# Patient Record
Sex: Female | Born: 1970 | Race: White | Hispanic: No | Marital: Married | Smoking: Never smoker
Health system: Southern US, Community
[De-identification: ages and names within clinical notes are randomized; demographics above are authoritative.]

## PROBLEM LIST (undated history)

## (undated) DIAGNOSIS — J45909 Unspecified asthma, uncomplicated: Secondary | ICD-10-CM

## (undated) DIAGNOSIS — Z9889 Other specified postprocedural states: Secondary | ICD-10-CM

## (undated) DIAGNOSIS — R51 Headache: Secondary | ICD-10-CM

## (undated) DIAGNOSIS — I10 Essential (primary) hypertension: Secondary | ICD-10-CM

## (undated) DIAGNOSIS — Z87442 Personal history of urinary calculi: Secondary | ICD-10-CM

## (undated) DIAGNOSIS — R519 Headache, unspecified: Secondary | ICD-10-CM

## (undated) DIAGNOSIS — R112 Nausea with vomiting, unspecified: Secondary | ICD-10-CM

## (undated) DIAGNOSIS — N200 Calculus of kidney: Secondary | ICD-10-CM

## (undated) HISTORY — PX: TONSILLECTOMY: SUR1361

## (undated) HISTORY — PX: OTHER SURGICAL HISTORY: SHX169

## (undated) HISTORY — DX: Calculus of kidney: N20.0

## (undated) HISTORY — PX: WISDOM TOOTH EXTRACTION: SHX21

## (undated) SURGICAL SUPPLY — 11 items
BAG RETRIEVAL 12/15 (BASKET) IMPLANT
DRIVER NDL MEGA SUTCUT DVNCXI (INSTRUMENTS) ×1 IMPLANT
IRRIG SUCT STRYKERFLOW 2 WTIP (MISCELLANEOUS) IMPLANT
NDL HYPO 21X1.5 SAFETY (NEEDLE) ×1 IMPLANT
NDL INSUFFLATION 14GA 120MM (NEEDLE) IMPLANT
NDL SPNL 20GX3.5 QUINCKE YW (NEEDLE) IMPLANT
OBTURATOR OPTICAL STND 8 DVNC (TROCAR) IMPLANT
RTRCTR WOUND ALEXIS 34CM XLRG (MISCELLANEOUS) ×1 IMPLANT
SYS BAG RETRIEVAL 10MM (BASKET) IMPLANT
SYS RETRIEVAL 5MM INZII UNIV (BASKET) IMPLANT
SYS WOUND ALEXIS 18CM MED (MISCELLANEOUS) IMPLANT

---

## 2018-02-13 ENCOUNTER — Other Ambulatory Visit: Payer: Self-pay | Admitting: Family Medicine

## 2018-02-13 DIAGNOSIS — R222 Localized swelling, mass and lump, trunk: Secondary | ICD-10-CM

## 2018-02-17 ENCOUNTER — Other Ambulatory Visit: Payer: Self-pay | Admitting: Family Medicine

## 2018-02-17 ENCOUNTER — Ambulatory Visit
Admission: RE | Admit: 2018-02-17 | Discharge: 2018-02-17 | Disposition: A | Discharge status: Home / Self Care | Payer: BC Managed Care – PPO | Source: Ambulatory Visit | Attending: Family Medicine | Admitting: Family Medicine

## 2018-02-17 DIAGNOSIS — R222 Localized swelling, mass and lump, trunk: Secondary | ICD-10-CM

## 2018-02-28 ENCOUNTER — Other Ambulatory Visit: Payer: Self-pay | Admitting: Family Medicine

## 2018-02-28 DIAGNOSIS — R222 Localized swelling, mass and lump, trunk: Secondary | ICD-10-CM

## 2018-03-04 ENCOUNTER — Ambulatory Visit
Admission: RE | Admit: 2018-03-04 | Discharge: 2018-03-04 | Disposition: A | Discharge status: Home / Self Care | Payer: BC Managed Care – PPO | Source: Ambulatory Visit | Attending: Family Medicine | Admitting: Family Medicine

## 2018-03-04 DIAGNOSIS — R222 Localized swelling, mass and lump, trunk: Secondary | ICD-10-CM

## 2018-03-04 MED ORDER — GADOBENATE DIMEGLUMINE 529 MG/ML IV SOLN
18.0000 mL | Freq: Once | INTRAVENOUS | Status: AC | PRN
  Administered 2018-03-04: 18 mL via INTRAVENOUS

## 2018-03-13 ENCOUNTER — Other Ambulatory Visit: Payer: Self-pay | Admitting: General Surgery

## 2018-03-15 ENCOUNTER — Encounter (HOSPITAL_COMMUNITY): Payer: Self-pay | Admitting: *Deleted

## 2018-03-15 ENCOUNTER — Other Ambulatory Visit: Payer: Self-pay

## 2018-03-15 NOTE — Progress Notes (Signed)
Spoke with pt for pre-op call. Pt denies cardiac history or diabetes. Pt is treated for HTN,

## 2018-03-16 ENCOUNTER — Encounter (HOSPITAL_COMMUNITY)
Admission: RE | Disposition: A | Discharge status: Home / Self Care | Payer: Self-pay | Source: Ambulatory Visit | Attending: General Surgery

## 2018-03-16 ENCOUNTER — Encounter (HOSPITAL_COMMUNITY): Payer: Self-pay | Admitting: *Deleted

## 2018-03-16 ENCOUNTER — Ambulatory Visit (HOSPITAL_COMMUNITY): Payer: BC Managed Care – PPO | Admitting: Certified Registered Nurse Anesthetist

## 2018-03-16 ENCOUNTER — Ambulatory Visit (HOSPITAL_COMMUNITY)
Admission: RE | Admit: 2018-03-16 | Discharge: 2018-03-16 | Disposition: A | Discharge status: Home / Self Care | Payer: BC Managed Care – PPO | Source: Ambulatory Visit | Attending: General Surgery | Admitting: General Surgery

## 2018-03-16 DIAGNOSIS — E669 Obesity, unspecified: Secondary | ICD-10-CM | POA: Diagnosis not present

## 2018-03-16 DIAGNOSIS — Z79899 Other long term (current) drug therapy: Secondary | ICD-10-CM | POA: Insufficient documentation

## 2018-03-16 DIAGNOSIS — Z820 Family history of epilepsy and other diseases of the nervous system: Secondary | ICD-10-CM | POA: Diagnosis not present

## 2018-03-16 DIAGNOSIS — R222 Localized swelling, mass and lump, trunk: Secondary | ICD-10-CM | POA: Diagnosis present

## 2018-03-16 DIAGNOSIS — Z885 Allergy status to narcotic agent status: Secondary | ICD-10-CM | POA: Diagnosis not present

## 2018-03-16 DIAGNOSIS — R51 Headache: Secondary | ICD-10-CM | POA: Diagnosis not present

## 2018-03-16 DIAGNOSIS — Z809 Family history of malignant neoplasm, unspecified: Secondary | ICD-10-CM | POA: Insufficient documentation

## 2018-03-16 DIAGNOSIS — Z6834 Body mass index (BMI) 34.0-34.9, adult: Secondary | ICD-10-CM | POA: Insufficient documentation

## 2018-03-16 DIAGNOSIS — D171 Benign lipomatous neoplasm of skin and subcutaneous tissue of trunk: Secondary | ICD-10-CM | POA: Insufficient documentation

## 2018-03-16 DIAGNOSIS — Z808 Family history of malignant neoplasm of other organs or systems: Secondary | ICD-10-CM | POA: Insufficient documentation

## 2018-03-16 DIAGNOSIS — J45909 Unspecified asthma, uncomplicated: Secondary | ICD-10-CM | POA: Diagnosis not present

## 2018-03-16 DIAGNOSIS — I10 Essential (primary) hypertension: Secondary | ICD-10-CM | POA: Diagnosis not present

## 2018-03-16 DIAGNOSIS — Z8249 Family history of ischemic heart disease and other diseases of the circulatory system: Secondary | ICD-10-CM | POA: Insufficient documentation

## 2018-03-16 DIAGNOSIS — M549 Dorsalgia, unspecified: Secondary | ICD-10-CM | POA: Insufficient documentation

## 2018-03-16 HISTORY — DX: Headache, unspecified: R51.9

## 2018-03-16 HISTORY — DX: Other specified postprocedural states: Z98.890

## 2018-03-16 HISTORY — DX: Unspecified asthma, uncomplicated: J45.909

## 2018-03-16 HISTORY — PX: EXCISION OF KELOID: SHX6267

## 2018-03-16 HISTORY — DX: Essential (primary) hypertension: I10

## 2018-03-16 HISTORY — DX: Nausea with vomiting, unspecified: R11.2

## 2018-03-16 HISTORY — DX: Headache: R51

## 2018-03-16 LAB — BASIC METABOLIC PANEL
Anion gap: 9 (ref 5–15)
BUN: 10 mg/dL (ref 6–20)
CALCIUM: 8.9 mg/dL (ref 8.9–10.3)
CO2: 23 mmol/L (ref 22–32)
CREATININE: 0.66 mg/dL (ref 0.44–1.00)
Chloride: 106 mmol/L (ref 101–111)
GFR calc Af Amer: >60 mL/min (ref >60)
GFR calc non Af Amer: >60 mL/min (ref >60)
GLUCOSE: 103 mg/dL — AB (ref 65–99)
Potassium: 3.7 mmol/L (ref 3.5–5.1)
Sodium: 138 mmol/L (ref 135–145)

## 2018-03-16 LAB — CBC
HEMATOCRIT: 43.9 % (ref 36.0–46.0)
HEMOGLOBIN: 15.3 g/dL — AB (ref 12.0–15.0)
MCH: 30.3 pg (ref 26.0–34.0)
MCHC: 34.9 g/dL (ref 30.0–36.0)
MCV: 86.9 fL (ref 78.0–100.0)
Platelets: 338 10*3/uL (ref 150–400)
RBC: 5.05 MIL/uL (ref 3.87–5.11)
RDW: 13.5 % (ref 11.5–15.5)
WBC: 7.1 10*3/uL (ref 4.0–10.5)

## 2018-03-16 SURGERY — EXCISION, KELOID
Anesthesia: General | Site: Back | Laterality: Right

## 2018-03-16 MED ORDER — LIDOCAINE 2% (20 MG/ML) 5 ML SYRINGE
INTRAMUSCULAR | Status: AC
  Filled 2018-03-16: qty 5

## 2018-03-16 MED ORDER — CELECOXIB 200 MG PO CAPS
200.0000 mg | ORAL_CAPSULE | ORAL | Status: AC
  Administered 2018-03-16: 200 mg via ORAL
  Filled 2018-03-16: qty 1

## 2018-03-16 MED ORDER — FENTANYL CITRATE (PF) 250 MCG/5ML IJ SOLN
INTRAMUSCULAR | Status: AC
  Filled 2018-03-16: qty 5

## 2018-03-16 MED ORDER — PROPOFOL 10 MG/ML IV BOLUS
INTRAVENOUS | Status: DC | PRN
  Administered 2018-03-16: 150 mg via INTRAVENOUS

## 2018-03-16 MED ORDER — MIDAZOLAM HCL 2 MG/2ML IJ SOLN
INTRAMUSCULAR | Status: AC
  Filled 2018-03-16: qty 2

## 2018-03-16 MED ORDER — DEXAMETHASONE SODIUM PHOSPHATE 10 MG/ML IJ SOLN
INTRAMUSCULAR | Status: AC
  Filled 2018-03-16: qty 1

## 2018-03-16 MED ORDER — CEFAZOLIN SODIUM-DEXTROSE 2-4 GM/100ML-% IV SOLN
2.0000 g | INTRAVENOUS | Status: AC
  Administered 2018-03-16: 2 g via INTRAVENOUS
  Filled 2018-03-16: qty 100

## 2018-03-16 MED ORDER — MIDAZOLAM HCL 2 MG/2ML IJ SOLN
INTRAMUSCULAR | Status: DC | PRN
  Administered 2018-03-16 (×2): 1 mg via INTRAVENOUS

## 2018-03-16 MED ORDER — CHLORHEXIDINE GLUCONATE CLOTH 2 % EX PADS: 6.0000 | MEDICATED_PAD | Freq: Once | CUTANEOUS | Status: DC

## 2018-03-16 MED ORDER — ROCURONIUM BROMIDE 10 MG/ML (PF) SYRINGE
PREFILLED_SYRINGE | INTRAVENOUS | Status: DC | PRN
  Administered 2018-03-16: 40 mg via INTRAVENOUS

## 2018-03-16 MED ORDER — SODIUM CHLORIDE 0.9 % IJ SOLN
INTRAMUSCULAR | Status: DC | PRN
  Administered 2018-03-16: 20 mL via INTRAVENOUS

## 2018-03-16 MED ORDER — ONDANSETRON HCL 4 MG/2ML IJ SOLN
INTRAMUSCULAR | Status: AC
  Filled 2018-03-16: qty 2

## 2018-03-16 MED ORDER — ROCURONIUM BROMIDE 10 MG/ML (PF) SYRINGE
PREFILLED_SYRINGE | INTRAVENOUS | Status: AC
  Filled 2018-03-16: qty 5

## 2018-03-16 MED ORDER — KETOROLAC TROMETHAMINE 30 MG/ML IJ SOLN
INTRAMUSCULAR | Status: AC
  Filled 2018-03-16: qty 1

## 2018-03-16 MED ORDER — FENTANYL CITRATE (PF) 100 MCG/2ML IJ SOLN
INTRAMUSCULAR | Status: AC
  Filled 2018-03-16: qty 2

## 2018-03-16 MED ORDER — EPHEDRINE 5 MG/ML INJ
INTRAVENOUS | Status: AC
  Filled 2018-03-16: qty 10

## 2018-03-16 MED ORDER — PHENYLEPHRINE 40 MCG/ML (10ML) SYRINGE FOR IV PUSH (FOR BLOOD PRESSURE SUPPORT)
PREFILLED_SYRINGE | INTRAVENOUS | Status: DC | PRN
  Administered 2018-03-16: 80 ug via INTRAVENOUS

## 2018-03-16 MED ORDER — SUGAMMADEX SODIUM 200 MG/2ML IV SOLN
INTRAVENOUS | Status: DC | PRN
  Administered 2018-03-16: 180 mg via INTRAVENOUS

## 2018-03-16 MED ORDER — PROMETHAZINE HCL 25 MG/ML IJ SOLN: 6.2500 mg | INTRAMUSCULAR | Status: DC | PRN

## 2018-03-16 MED ORDER — DEXAMETHASONE SODIUM PHOSPHATE 10 MG/ML IJ SOLN
INTRAMUSCULAR | Status: DC | PRN
  Administered 2018-03-16: 10 mg via INTRAVENOUS

## 2018-03-16 MED ORDER — SUCCINYLCHOLINE CHLORIDE 200 MG/10ML IV SOSY
PREFILLED_SYRINGE | INTRAVENOUS | Status: AC
  Filled 2018-03-16: qty 10

## 2018-03-16 MED ORDER — OXYCODONE HCL 5 MG PO TABS: 2.5000 mg | ORAL_TABLET | Freq: Four times a day (QID) | ORAL | 0 refills | Status: DC | PRN

## 2018-03-16 MED ORDER — FENTANYL CITRATE (PF) 250 MCG/5ML IJ SOLN
INTRAMUSCULAR | Status: DC | PRN
  Administered 2018-03-16: 50 ug via INTRAVENOUS
  Administered 2018-03-16: 100 ug via INTRAVENOUS

## 2018-03-16 MED ORDER — LIDOCAINE 2% (20 MG/ML) 5 ML SYRINGE
INTRAMUSCULAR | Status: DC | PRN
  Administered 2018-03-16: 80 mg via INTRAVENOUS

## 2018-03-16 MED ORDER — PROPOFOL 10 MG/ML IV BOLUS
INTRAVENOUS | Status: AC
  Filled 2018-03-16: qty 20

## 2018-03-16 MED ORDER — ONDANSETRON HCL 4 MG/2ML IJ SOLN
INTRAMUSCULAR | Status: DC | PRN
  Administered 2018-03-16: 4 mg via INTRAVENOUS

## 2018-03-16 MED ORDER — ACETAMINOPHEN 500 MG PO TABS
1000.0000 mg | ORAL_TABLET | ORAL | Status: AC
  Administered 2018-03-16: 1000 mg via ORAL
  Filled 2018-03-16: qty 2

## 2018-03-16 MED ORDER — 0.9 % SODIUM CHLORIDE (POUR BTL) OPTIME
TOPICAL | Status: DC | PRN
  Administered 2018-03-16: 1000 mL

## 2018-03-16 MED ORDER — BUPIVACAINE LIPOSOME 1.3 % IJ SUSP
20.0000 mL | INTRAMUSCULAR | Status: AC
  Administered 2018-03-16: 20 mL
  Filled 2018-03-16: qty 20

## 2018-03-16 MED ORDER — SUGAMMADEX SODIUM 200 MG/2ML IV SOLN
INTRAVENOUS | Status: AC
  Filled 2018-03-16: qty 2

## 2018-03-16 MED ORDER — PHENYLEPHRINE 40 MCG/ML (10ML) SYRINGE FOR IV PUSH (FOR BLOOD PRESSURE SUPPORT)
PREFILLED_SYRINGE | INTRAVENOUS | Status: AC
  Filled 2018-03-16: qty 10

## 2018-03-16 MED ORDER — GABAPENTIN 300 MG PO CAPS
300.0000 mg | ORAL_CAPSULE | ORAL | Status: AC
  Administered 2018-03-16: 300 mg via ORAL
  Filled 2018-03-16: qty 1

## 2018-03-16 MED ORDER — LACTATED RINGERS IV SOLN
INTRAVENOUS | Status: DC
  Administered 2018-03-16: 09:00:00 via INTRAVENOUS

## 2018-03-16 MED ORDER — FENTANYL CITRATE (PF) 100 MCG/2ML IJ SOLN
25.0000 ug | INTRAMUSCULAR | Status: DC | PRN
  Administered 2018-03-16: 50 ug via INTRAVENOUS

## 2018-03-16 MED ORDER — KETOROLAC TROMETHAMINE 30 MG/ML IJ SOLN
INTRAMUSCULAR | Status: DC | PRN
  Administered 2018-03-16: 30 mg via INTRAVENOUS

## 2018-03-16 SURGICAL SUPPLY — 52 items
BENZOIN TINCTURE PRP APPL 2/3 (GAUZE/BANDAGES/DRESSINGS) ×3 IMPLANT
BLADE SURG 10 STRL SS (BLADE) ×3 IMPLANT
CANISTER SUCT 3000ML PPV (MISCELLANEOUS) ×3 IMPLANT
CHLORAPREP W/TINT 26ML (MISCELLANEOUS) ×3 IMPLANT
CLIP TI LARGE 6 (CLIP) ×6 IMPLANT
CLOSURE WOUND 1/2 X4 (GAUZE/BANDAGES/DRESSINGS) ×1
COVER SURGICAL LIGHT HANDLE (MISCELLANEOUS) ×3 IMPLANT
DERMABOND ADVANCED (GAUZE/BANDAGES/DRESSINGS)
DERMABOND ADVANCED .7 DNX12 (GAUZE/BANDAGES/DRESSINGS) IMPLANT
DRAPE LAPAROSCOPIC ABDOMINAL (DRAPES) ×3 IMPLANT
DRAPE LAPAROTOMY 100X72 PEDS (DRAPES) IMPLANT
DRAPE UTILITY XL STRL (DRAPES) ×6 IMPLANT
DRSG TEGADERM 4X4.75 (GAUZE/BANDAGES/DRESSINGS) ×6 IMPLANT
ELECT CAUTERY BLADE 6.4 (BLADE) ×6 IMPLANT
ELECT REM PT RETURN 9FT ADLT (ELECTROSURGICAL) ×3
ELECTRODE REM PT RTRN 9FT ADLT (ELECTROSURGICAL) ×1 IMPLANT
GAUZE SPONGE 4X4 12PLY STRL (GAUZE/BANDAGES/DRESSINGS) IMPLANT
GAUZE SPONGE 4X4 16PLY XRAY LF (GAUZE/BANDAGES/DRESSINGS) ×3 IMPLANT
GLOVE BIO SURGEON STRL SZ 6 (GLOVE) ×3 IMPLANT
GLOVE BIOGEL PI IND STRL 6.5 (GLOVE) ×2 IMPLANT
GLOVE BIOGEL PI INDICATOR 6.5 (GLOVE) ×4
GLOVE INDICATOR 6.5 STRL GRN (GLOVE) ×3 IMPLANT
GOWN STRL REUS W/ TWL LRG LVL3 (GOWN DISPOSABLE) ×1 IMPLANT
GOWN STRL REUS W/TWL 2XL LVL3 (GOWN DISPOSABLE) ×3 IMPLANT
GOWN STRL REUS W/TWL LRG LVL3 (GOWN DISPOSABLE) ×2
KIT BASIN OR (CUSTOM PROCEDURE TRAY) ×3 IMPLANT
KIT TURNOVER KIT B (KITS) ×3 IMPLANT
NEEDLE HYPO 25GX1X1/2 BEV (NEEDLE) ×6 IMPLANT
NS IRRIG 1000ML POUR BTL (IV SOLUTION) ×3 IMPLANT
PACK SURGICAL SETUP 50X90 (CUSTOM PROCEDURE TRAY) ×3 IMPLANT
PAD ARMBOARD 7.5X6 YLW CONV (MISCELLANEOUS) ×6 IMPLANT
PENCIL BUTTON HOLSTER BLD 10FT (ELECTRODE) ×3 IMPLANT
SPECIMEN JAR SMALL (MISCELLANEOUS) ×3 IMPLANT
SPONGE LAP 18X18 X RAY DECT (DISPOSABLE) ×3 IMPLANT
SPONGE LAP 4X18 X RAY DECT (DISPOSABLE) IMPLANT
STRIP CLOSURE SKIN 1/2X4 (GAUZE/BANDAGES/DRESSINGS) ×2 IMPLANT
SUT MNCRL AB 4-0 PS2 18 (SUTURE) ×3 IMPLANT
SUT MON AB 4-0 PC3 18 (SUTURE) ×3 IMPLANT
SUT SILK 2 0 PERMA HAND 18 BK (SUTURE) IMPLANT
SUT SILK 2 0 SH CR/8 (SUTURE) ×3 IMPLANT
SUT VIC AB 2-0 CT1 27 (SUTURE) ×8
SUT VIC AB 2-0 CT1 TAPERPNT 27 (SUTURE) ×4 IMPLANT
SUT VIC AB 3-0 SH 27 (SUTURE) ×4
SUT VIC AB 3-0 SH 27X BRD (SUTURE) ×2 IMPLANT
SUT VIC AB 3-0 SH 8-18 (SUTURE) ×3 IMPLANT
SYR BULB 3OZ (MISCELLANEOUS) ×6 IMPLANT
SYR CONTROL 10ML LL (SYRINGE) ×6 IMPLANT
TOWEL OR 17X24 6PK STRL BLUE (TOWEL DISPOSABLE) ×3 IMPLANT
TOWEL OR 17X26 10 PK STRL BLUE (TOWEL DISPOSABLE) ×3 IMPLANT
TUBE CONNECTING 12'X1/4 (SUCTIONS)
TUBE CONNECTING 12X1/4 (SUCTIONS) IMPLANT
YANKAUER SUCT BULB TIP NO VENT (SUCTIONS) IMPLANT

## 2018-03-16 NOTE — Interval H&P Note (Signed)
History and Physical Interval Note:  03/16/2018 10:40 AM  Haley Roach  has presented today for surgery, with the diagnosis of BACK MASS  The various methods of treatment have been discussed with the patient and family. After consideration of risks, benefits and other options for treatment, the patient has consented to  Procedure(s): EXCISION OF LOWER RIGHT BACK MASS (Right) as a surgical intervention .  The patient's history has been reviewed, patient examined, no change in status, stable for surgery.  I have reviewed the patient's chart and labs.  Questions were answered to the patient's satisfaction.     Stark Klein

## 2018-03-16 NOTE — Op Note (Signed)
PRE-OPERATIVE DIAGNOSIS: right back mass  POST-OPERATIVE DIAGNOSIS:  Same  PROCEDURE:  Procedure(s): Excision of right back mass, 5x12x5 cm, partially subfascial  SURGEON:  Surgeon(s): Stark Klein, MD  ANESTHESIA:   general and exparel  DRAINS: none   LOCAL MEDICATIONS USED:  OTHER exparel  SPECIMEN:  Source of Specimen:  right back mass  DISPOSITION OF SPECIMEN:  PATHOLOGY  COUNTS:  YES  DICTATION: .Dragon Dictation  PLAN OF CARE: Discharge to home after PACU  PATIENT DISPOSITION:  PACU - hemodynamically stable.  FINDINGS:  Soft fatty mass except for small area of firmness medially  EBL: <25 mL  PROCEDURE:  Patient was identified in the holding area where she was taken to the operating room and intubated while supine on the stretcher.  Patient was then placed into the prone jackknife position.  The right back was prepped and draped in sterile fashion.  The mass was at the superior aspect of the iliac crest.  A timeout was performed according to the surgical safety checklist.  When all was correct, we continued.    A transverse incision was made overlying the line of the mass.  The subcutaneous tissues were divided with the cautery.  The mass was identified and was dissected out in all directions.  The medial and deep portion of the mass was subfascial in nature.  The fascia was divided with the cautery.  There were several small feeding vessels that were suture-ligated.  The cautery was used to create flaps around the mass.  The wound was irrigated.  Large clips were placed in the cavity at the furthest extent of the mass in all directions.  Once adequate hemostasis had been achieved, Exparel was administered in the superficial and deep layer of the incision.  The wound was then closed, taking care to minimize the cavity.  3 running rows of 2-0 Vicryl were used to close down the space.  The skin was then reapproximated with three 3-0 Vicryl deep dermal sutures.  Is closed with  a running 4-0 Monocryl suture.  The wound was then cleaned, dried, and dressed with benzoin, Steri-Strips, gauze, and Tegaderm.  Needle, sponge, and instrument counts were correct x2.    The patient was then flipped back to the stretcher.  She was allowed to emerge from anesthesia and taken to the PACU in stable condition.

## 2018-03-16 NOTE — Progress Notes (Signed)
Urine preg negative

## 2018-03-16 NOTE — Anesthesia Preprocedure Evaluation (Addendum)
Anesthesia Evaluation  Patient identified by MRN, date of birth, ID band Patient awake    Reviewed: Allergy & Precautions, NPO status , Patient's Chart, lab work & pertinent test results  History of Anesthesia Complications (+) PONV and history of anesthetic complications  Airway Mallampati: II  TM Distance: >3 FB Neck ROM: Full    Dental  (+) Teeth Intact, Dental Advisory Given   Pulmonary asthma ,    Pulmonary exam normal breath sounds clear to auscultation       Cardiovascular hypertension, Pt. on home beta blockers Normal cardiovascular exam Rhythm:Regular Rate:Normal     Neuro/Psych  Headaches, negative psych ROS   GI/Hepatic negative GI ROS, Neg liver ROS,   Endo/Other  Obesity   Renal/GU negative Renal ROS     Musculoskeletal negative musculoskeletal ROS (+)   Abdominal   Peds  Hematology negative hematology ROS (+)   Anesthesia Other Findings Day of surgery medications reviewed with the patient.  Reproductive/Obstetrics                            Anesthesia Physical Anesthesia Plan  ASA: II  Anesthesia Plan: General   Post-op Pain Management:    Induction: Intravenous  PONV Risk Score and Plan: 4 or greater and Ondansetron, Dexamethasone and Midazolam  Airway Management Planned: Oral ETT  Additional Equipment:   Intra-op Plan:   Post-operative Plan: Extubation in OR  Informed Consent: I have reviewed the patients History and Physical, chart, labs and discussed the procedure including the risks, benefits and alternatives for the proposed anesthesia with the patient or authorized representative who has indicated his/her understanding and acceptance.   Dental advisory given  Plan Discussed with: CRNA and Anesthesiologist  Anesthesia Plan Comments: (Risks/benefits of general anesthesia discussed with patient including risk of damage to teeth, lips, gum, and tongue,  nausea/vomiting, allergic reactions to medications, and the possibility of heart attack, stroke and death.  All patient questions answered.  Patient wishes to proceed.)       Anesthesia Quick Evaluation

## 2018-03-16 NOTE — Discharge Instructions (Addendum)
[place] Office Phone Number [phone]   POST OP INSTRUCTIONS  Always review your discharge instruction sheet given to you by the facility where your surgery was performed.  IF YOU HAVE DISABILITY OR FAMILY LEAVE FORMS, YOU MUST BRING THEM TO THE OFFICE FOR PROCESSING.  DO NOT GIVE THEM TO YOUR DOCTOR.  1. A prescription for pain medication may be given to you upon discharge.  Take your pain medication as prescribed, if needed.  If narcotic pain medicine is not needed, then you may take acetaminophen (Tylenol) or ibuprofen (Advil) as needed. 2. Take your usually prescribed medications unless otherwise directed 3. If you need a refill on your pain medication, please contact your pharmacy.  They will contact our office to request authorization.  Prescriptions will not be filled after 5pm or on week-ends. 4. You should eat very light the first 24 hours after surgery, such as soup, crackers, pudding, etc.  Resume your normal diet the day after surgery 5. It is common to experience some constipation if taking pain medication after surgery.  Increasing fluid intake and taking a stool softener will usually help or prevent this problem from occurring.  A mild laxative (Milk of Magnesia or Miralax) should be taken according to package directions if there are no bowel movements after 48 hours. 6. You may shower in 48 hours.  The surgical glue will flake off in 2-3 weeks.   7. ACTIVITIES:  No strenuous activity or heavy lifting for 1 week.   a. You may drive when you no longer are taking prescription pain medication, you can comfortably wear a seatbelt, and you can safely maneuver your car and apply brakes. b. RETURN TO WORK:  __________2 weeks_______________ Haley Roach should see your doctor in the office for a follow-up appointment approximately three-four weeks after your surgery.    WHEN TO CALL YOUR DOCTOR: 1. Fever over 101.0 2. Nausea and/or vomiting. 3. Extreme swelling or  bruising. 4. Continued bleeding from incision. 5. Increased pain, redness, or drainage from the incision.  The clinic staff is available to answer your questions during regular business hours.  Please dont hesitate to call and ask to speak to one of the nurses for clinical concerns.  If you have a medical emergency, go to the nearest emergency room or call 911.  A surgeon from [facility] is always on call at the hospital.  For further questions, please visit [email or web address]    General Anesthesia, Adult, Care After These instructions provide you with information about caring for yourself after your procedure. Your health care provider may also give you more specific instructions. Your treatment has been planned according to current medical practices, but problems sometimes occur. Call your health care provider if you have any problems or questions after your procedure. What can I expect after the procedure? After the procedure, it is common to have:  Vomiting.  A sore throat.  Mental slowness.  It is common to feel:  Nauseous.  Cold or shivery.  Sleepy.  Tired.  Sore or achy, even in parts of your body where you did not have surgery.  Follow these instructions at home: For at least 24 hours after the procedure:  Do not: ? Participate in activities where you could fall or become injured. ? Drive. ? Use heavy machinery. ? Drink alcohol. ? Take sleeping pills or medicines that cause drowsiness. ? Make important decisions or sign legal documents. ? Take care of children on your own.  Rest. Eating and  drinking  If you vomit, drink water, juice, or soup when you can drink without vomiting.  Drink enough fluid to keep your urine clear or pale yellow.  Make sure you have little or no nausea before eating solid foods.  Follow the diet recommended by your health care provider. General instructions  Have a responsible adult stay with you until you  are awake and alert.  Return to your normal activities as told by your health care provider. Ask your health care provider what activities are safe for you.  Take over-the-counter and prescription medicines only as told by your health care provider.  If you smoke, do not smoke without supervision.  Keep all follow-up visits as told by your health care provider. This is important. Contact a health care provider if:  You continue to have nausea or vomiting at home, and medicines are not helpful.  You cannot drink fluids or start eating again.  You cannot urinate after 8-12 hours.  You develop a skin rash.  You have fever.  You have increasing redness at the site of your procedure. Get help right away if:  You have difficulty breathing.  You have chest pain.  You have unexpected bleeding.  You feel that you are having a life-threatening or urgent problem. This information is not intended to replace advice given to you by your health care provider. Make sure you discuss any questions you have with your health care provider. Document Released: 02/14/2001 Document Revised: 04/12/2016 Document Reviewed: 10/23/2015 Elsevier Interactive Patient Education  [employer].

## 2018-03-16 NOTE — H&P (Signed)
Haley Roach Documented: 03/13/2018 9:40 AM Location: [facility] Patient #: [id number] DOB: 19-Apr-1971 Married / Language: Cleophus Molt / Race: White Female   History of Present Illness Stark Klein MD; 03/13/2018 10:45 AM) The patient is a 47 year old female who presents with a complaint of Mass. Pt is a lovely 47 yo F who is referred for consultation by Kimberlee Nearing, PA-C for a new right back mass. She presented with a sense of swelling noted while exercising around 4-5 months ago. She felt that this back area was asymmetric. One of her friends noted this as well. She went for a medication check for her BP and brought it up. Given that it felt a little firm, an u/s, then MRI were performed. There was concern for a possible liposarcoma. She denies pain. She has not had any history of cancer. She does not recall any trauma to this area.    lumbar MRI 03/04/2018 IMPRESSION: 1. 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 Focal area of rim calcified fat within the medial aspect of the mass may represent internal fat necrosis.  These results will be called to the ordering clinician or representative by the Radiologist Assistant, and communication documented in the PACS or zVision Dashboard   Past Surgical History (April Staton, [place]; 03/13/2018 10:39 AM) Oral Surgery  Tonsillectomy   Diagnostic Studies History (April Staton, [place]; 03/13/2018 10:39 AM) Colonoscopy  never Mammogram  within last year Pap Smear  1-5 years ago  Allergies Sabino Gasser; 03/13/2018 9:41 AM) Codeine/Codeine Derivatives   Medication History Sabino Gasser; 03/13/2018 9:43 AM) Amitriptyline HCl (25MG  Tablet, Oral) Active. Atorvastatin Calcium (20MG  Tablet, Oral) Active. Metoprolol Succinate ER (25MG  Tablet ER 24HR, Oral)  Active. Medications Reconciled  Social History (April Staton, [place]; 03/13/2018 10:39 AM) Alcohol use  Occasional alcohol use. Caffeine use  Carbonated beverages. No drug use  Tobacco use  Never smoker.  Family History (April Staton, [place]; 03/13/2018 10:39 AM) Cancer  Family Members In General. Hypertension  Mother, Sister. Melanoma  Family Members In General. Migraine Headache  Daughter, Family Members In [place], Sister, Son.  Pregnancy / Birth History (April Staton, [place]; 03/13/2018 10:39 AM) Age at menarche  72 years. Gravida  2 Irregular periods  Length (months) of breastfeeding  3-6 Maternal age  52-25 Para  2  Other Problems (April Staton, CMA; 03/13/2018 10:39 AM) Back Pain  High blood pressure     Review of Systems (April Staton CMA; 03/13/2018 10:39 AM) General Not Present- Appetite Loss, Chills, Fatigue, Fever, Night Sweats, Weight Gain and Weight Loss. Skin Not Present- Change in Wart/Mole, Dryness, Hives, Jaundice, New Lesions, Non-Healing Wounds, Rash and Ulcer. HEENT Present- Wears glasses/contact lenses. Not Present- Earache, Hearing Loss, Hoarseness, Nose Bleed, Oral Ulcers, Ringing in the Ears, Seasonal Allergies, Sinus Pain, Sore Throat, Visual Disturbances and Yellow Eyes. Respiratory Not Present- Bloody sputum, Chronic Cough, Difficulty Breathing, Snoring and Wheezing. Breast Not Present- Breast Mass, Breast Pain, Nipple Discharge and Skin Changes. Cardiovascular Not Present- Chest Pain, Difficulty Breathing Lying Down, Leg Cramps, Palpitations, Rapid Heart Rate, Shortness of Breath and Swelling of Extremities. Gastrointestinal Not Present- Abdominal Pain, Bloating, Bloody Stool, Change in Bowel Habits, Chronic diarrhea, Constipation, Difficulty Swallowing, Excessive gas, Gets full quickly at meals, Hemorrhoids, Indigestion, Nausea, Rectal Pain and Vomiting. Female Genitourinary Not Present- Frequency, Nocturia, Painful Urination, Pelvic Pain and  Urgency. Musculoskeletal Present- Back Pain and  Swelling of Extremities. Not Present- Joint Pain, Joint Stiffness, Muscle Pain and Muscle Weakness. Neurological Not Present- Decreased Memory, Fainting, Headaches, Numbness, Seizures, Tingling, Tremor, Trouble walking and Weakness. Psychiatric Present- Anxiety. Not Present- Bipolar, Change in Sleep Pattern, Depression, Fearful and Frequent crying. Endocrine Present- Hot flashes. Not Present- Cold Intolerance, Excessive Hunger, Hair Changes, Heat Intolerance and New Diabetes. Hematology Not Present- Blood Thinners, Easy Bruising, Excessive bleeding, Gland problems, HIV and Persistent Infections.  Vitals Sabino Gasser; 03/13/2018 9:43 AM) 03/13/2018 9:42 AM Weight: 199.13 lb Height: 63in Body Surface Area: 1.93 m Body Mass Index: 35.27 kg/m  Temp.: 98.59F(Oral)  Pulse: 103 (Regular)  BP: 130/80 (Sitting, Left Arm, Standard)       Physical Exam Stark Klein MD; 03/13/2018 10:44 AM) General Mental Status-Alert. General Appearance-Consistent with stated age. Hydration-Well hydrated. Voice-Normal.  Integumentary Note: mobile mass on the right near the right iliac crest/wing. this is around 10-15 cm and difficult to feel.   Head and Neck Head-normocephalic, atraumatic with no lesions or palpable masses. Trachea-midline. Thyroid Gland Characteristics - normal size and consistency.  Eye Eyeball - Bilateral-Extraocular movements intact. Sclera/Conjunctiva - Bilateral-No scleral icterus.  Chest and Lung Exam Chest and lung exam reveals -quiet, even and easy respiratory effort with no use of accessory muscles and on auscultation, normal breath sounds, no adventitious sounds and normal vocal resonance. Inspection Chest Wall - Normal. Back - normal.  Cardiovascular Cardiovascular examination reveals -normal heart sounds, regular rate and rhythm with no murmurs and normal pedal pulses  bilaterally.  Abdomen Inspection Inspection of the abdomen reveals - No Hernias. Palpation/Percussion Palpation and Percussion of the abdomen reveal - Soft, Non Tender, No Rebound tenderness, No Rigidity (guarding) and No hepatosplenomegaly. Auscultation Auscultation of the abdomen reveals - Bowel sounds normal.  Neurologic Neurologic evaluation reveals -alert and oriented x 3 with no impairment of recent or remote memory. Mental Status-Normal.  Musculoskeletal Global Assessment -Note: no gross deformities.  Normal Exam - Left-Upper Extremity Strength Normal and Lower Extremity Strength Normal. Normal Exam - Right-Upper Extremity Strength Normal and Lower Extremity Strength Normal.  Lymphatic Head & Neck  General Head & Neck Lymphatics: Bilateral - Description - Normal. Axillary  General Axillary Region: Bilateral - Description - Normal. Tenderness - Non Tender. Femoral & Inguinal  Generalized Femoral & Inguinal Lymphatics: Bilateral - Description - No Generalized lymphadenopathy.    Assessment & Plan Stark Klein MD; 03/13/2018 10:47 AM) SUBCUTANEOUS MASS OF BACK (R22.2) Impression: There is clinical concern over possible liposarcoma of the mass. We will plan to excise it. I do not feel that biopsy would change management as there is significant possibility of sampling error if benign.  I would plan wider margins than normal lipoma. I would also plan to leave clips at the borders of the mass.  I reviewed risks of surgery including bleeding, infection, possible drain placement, possible recurrent cancer, possible need for additional surgeries.  I discussed time off work and restrictions, etc. Current Plans Schedule for Surgery Pt Education - [facility] General Post-op [facility]CI   Signed by Stark Klein, MD (03/13/2018 10:48 AM)

## 2018-03-16 NOTE — Anesthesia Procedure Notes (Signed)
Procedure Name: Intubation Date/Time: 03/16/2018 11:11 AM Performed by: Julieta Bellini, CRNA Pre-anesthesia Checklist: Patient identified, Emergency Drugs available, Suction available and Patient being monitored Patient Re-evaluated:Patient Re-evaluated prior to induction Oxygen Delivery Method: Circle system utilized Preoxygenation: Pre-oxygenation with 100% oxygen Induction Type: IV induction Ventilation: Mask ventilation without difficulty Laryngoscope Size: Mac and 3 Grade View: Grade I Tube type: Oral Tube size: 7.0 mm Number of attempts: 1 Airway Equipment and Method: Stylet Placement Confirmation: ETT inserted through vocal cords under direct vision,  positive ETCO2 and breath sounds checked- equal and bilateral Secured at: 22 cm Tube secured with: Tape Dental Injury: Teeth and Oropharynx as per pre-operative assessment

## 2018-03-16 NOTE — Transfer of Care (Signed)
Immediate Anesthesia Transfer of Care Note  Patient: Haley Roach  Procedure(s) Performed: EXCISION OF LOWER RIGHT BACK MASS (Right Back)  Patient Location: PACU  Anesthesia Type:General  Level of Consciousness: drowsy and patient cooperative  Airway & Oxygen Therapy: Patient Spontanous Breathing  Post-op Assessment: Report given to RN, Post -op Vital signs reviewed and stable and Patient moving all extremities X 4  Post vital signs: Reviewed and stable  Last Vitals:  Vitals Value Taken Time  BP 163/87 03/16/2018 12:35 PM  Temp    Pulse 82 03/16/2018 12:35 PM  Resp 22 03/16/2018 12:35 PM  SpO2 93 % 03/16/2018 12:35 PM  Vitals shown include unvalidated device data.  Last Pain:  Vitals:   03/16/18 0845  TempSrc:   PainSc: 0-No pain      Patients Stated Pain Goal: 2 (25/00/37 0488)  Complications: No apparent anesthesia complications

## 2018-03-17 ENCOUNTER — Encounter (HOSPITAL_COMMUNITY): Payer: Self-pay | Admitting: General Surgery

## 2018-03-17 NOTE — Anesthesia Postprocedure Evaluation (Signed)
Anesthesia Post Note  Patient: Haley Roach  Procedure(s) Performed: EXCISION OF LOWER RIGHT BACK MASS (Right Back)     Patient location during evaluation: PACU Anesthesia Type: General Level of consciousness: awake and alert Pain management: pain level controlled Vital Signs Assessment: post-procedure vital signs reviewed and stable Respiratory status: spontaneous breathing, nonlabored ventilation and respiratory function stable Cardiovascular status: blood pressure returned to baseline and stable Postop Assessment: no apparent nausea or vomiting Anesthetic complications: no    Last Vitals:  Vitals:   03/16/18 1335 03/16/18 1341  BP: (!) 148/88 (!) 157/93  Pulse: 91 86  Resp: 18 18  Temp: 36.6 C   SpO2: 99% 98%    Last Pain:  Vitals:   03/16/18 1341  TempSrc:   PainSc: 3                  Catalina Gravel

## 2018-03-20 NOTE — Progress Notes (Signed)
Please let patient know pathology is benign.

## 2018-08-13 ENCOUNTER — Other Ambulatory Visit (HOSPITAL_COMMUNITY)
Admission: RE | Admit: 2018-08-13 | Discharge: 2018-08-13 | Disposition: A | Discharge status: Home / Self Care | Payer: BC Managed Care – PPO | Source: Ambulatory Visit | Attending: Family Medicine | Admitting: Family Medicine

## 2018-08-13 ENCOUNTER — Other Ambulatory Visit: Payer: Self-pay | Admitting: Family Medicine

## 2018-08-13 DIAGNOSIS — Z01419 Encounter for gynecological examination (general) (routine) without abnormal findings: Secondary | ICD-10-CM | POA: Insufficient documentation

## 2018-08-17 LAB — CYTOLOGY - PAP: DIAGNOSIS: NEGATIVE

## 2018-12-27 IMAGING — MR MR LUMBAR SPINE WO/W CM
4 of 8 series · 22 of 48 positions shown · IV contrast (multihance)
Comparison: Right flank ultrasound dated February 17, 2018.

CLINICAL DATA: Right flank soft tissue mass for the past 5 months.
No known injury.

EXAM:
MRI LUMBAR SPINE WITHOUT AND WITH CONTRAST
TECHNIQUE: Multiplanar and multiecho pulse sequences of the right flank were
obtained without and with intravenous contrast.
CONTRAST:  18mL MULTIHANCE GADOBENATE DIMEGLUMINE 529 MG/ML IV SOLN

[Series 5: T1 · axial · 5.0mm · 0.47mm/px · z∈[-125,-20]mm · 7 of 20 slices shown]
[im 1/20]
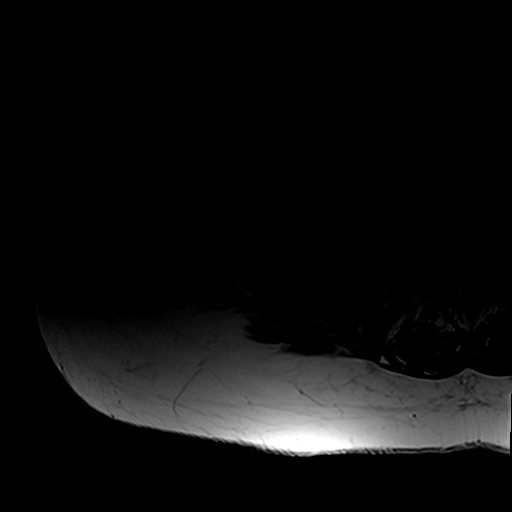
[im 4/20]
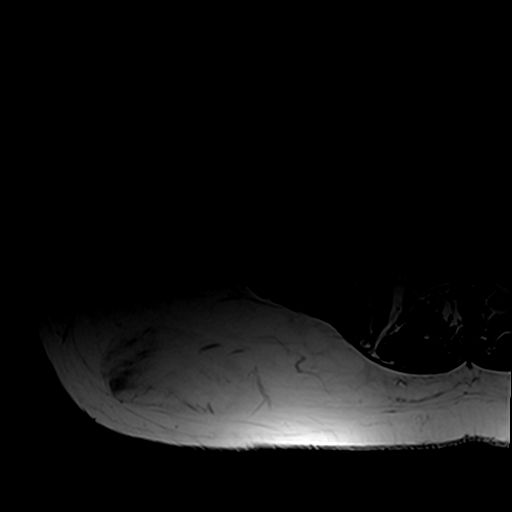
[im 7/20]
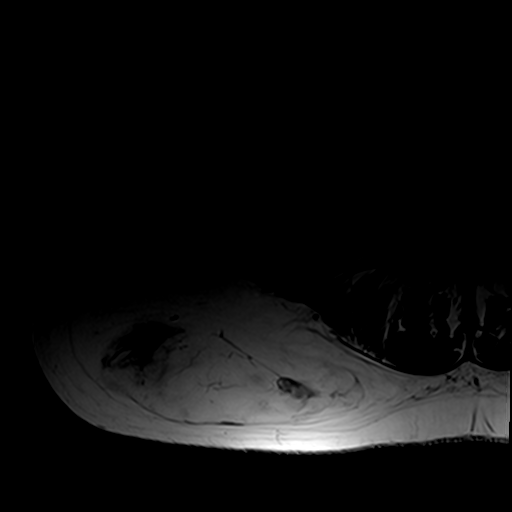
[im 10/20]
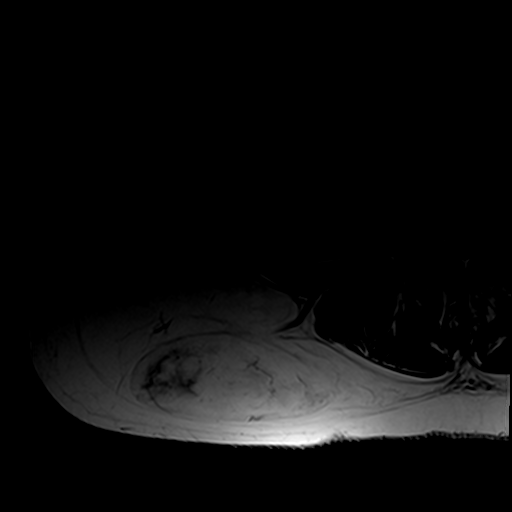
[im 13/20]
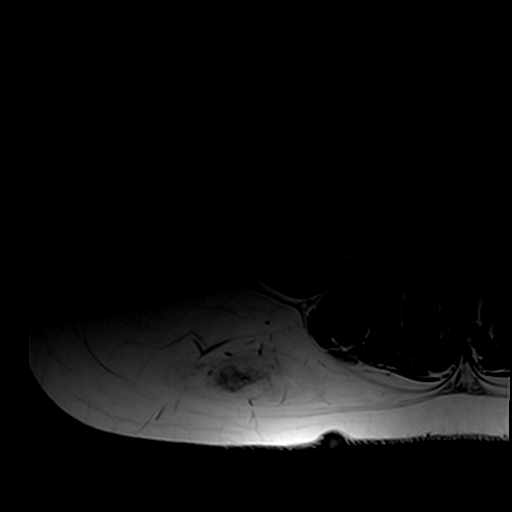
[im 16/20]
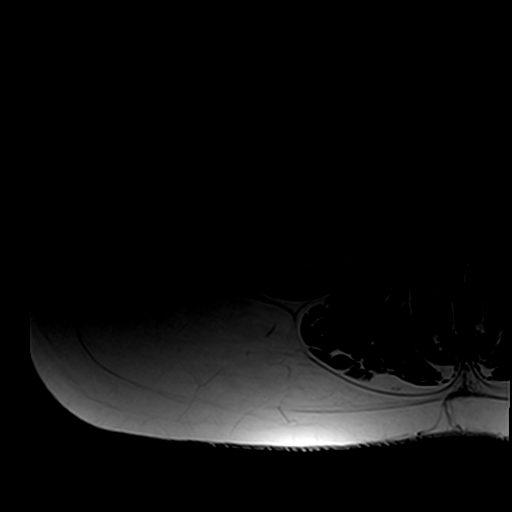
[im 20/20]
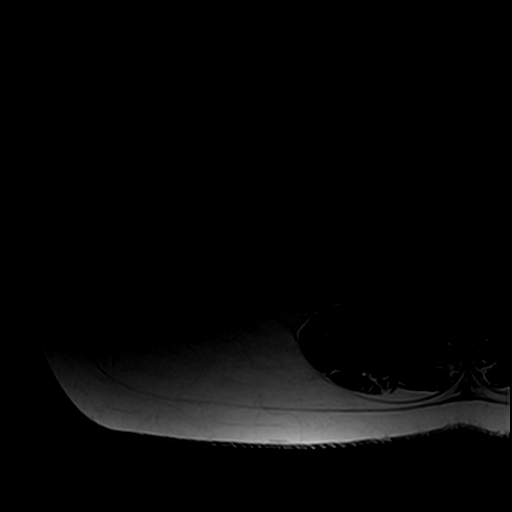

[Series 6: T1 fat-sat · axial · 5.0mm · 0.47mm/px · z∈[-125,-20]mm · 6 of 20 slices shown]
[im 1/20]
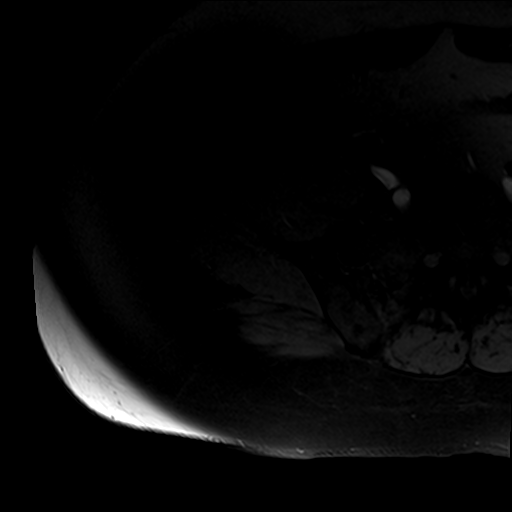
[im 4/20]
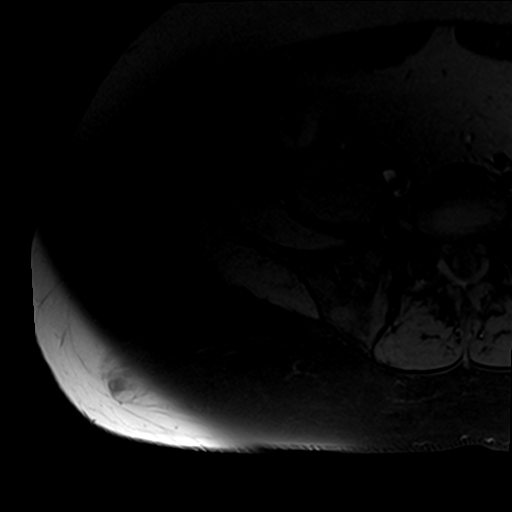
[im 8/20]
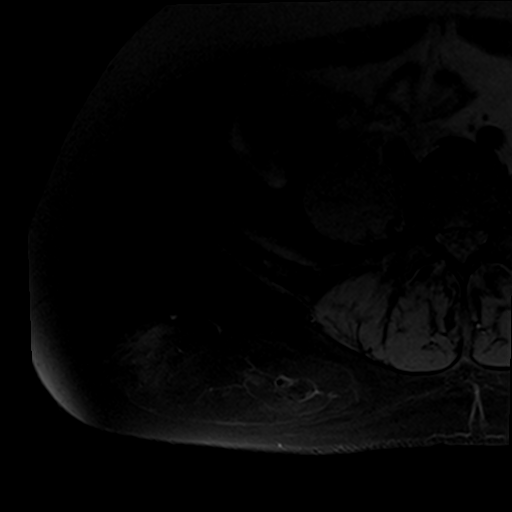
[im 12/20]
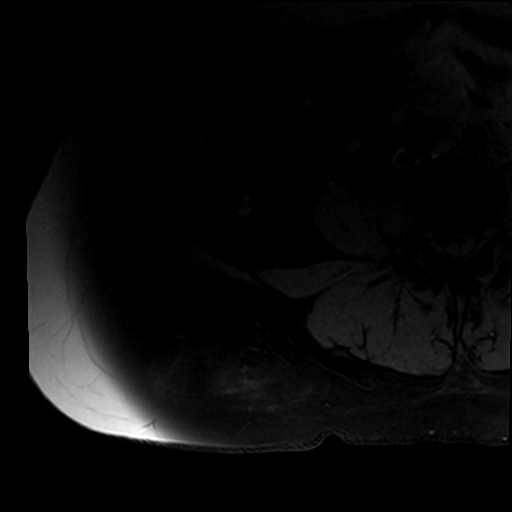
[im 16/20]
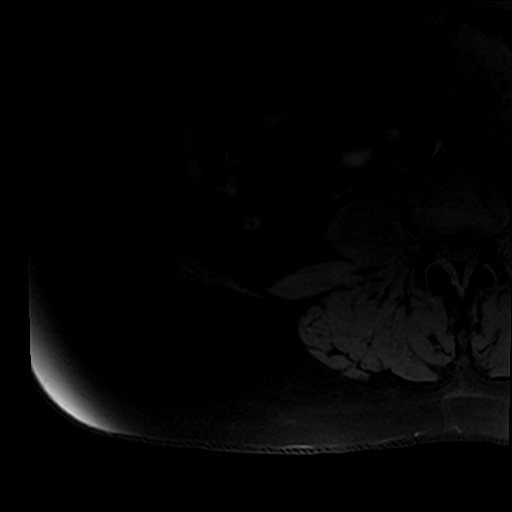
[im 20/20]
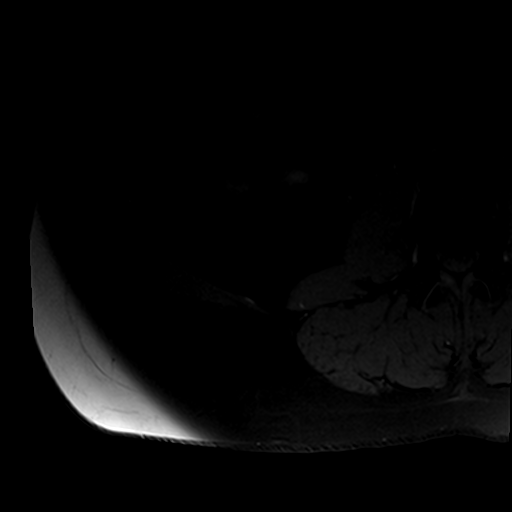

[Series 7: T2 fat-sat · axial · 5.0mm · 0.47mm/px · z∈[-125,-20]mm · 4 of 20 slices shown]
[im 1/20]
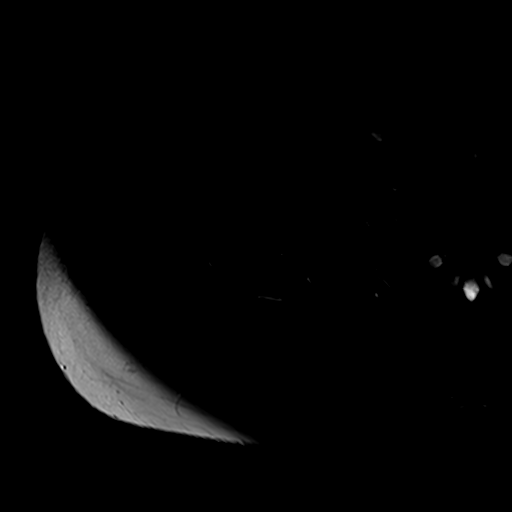
[im 4/20]
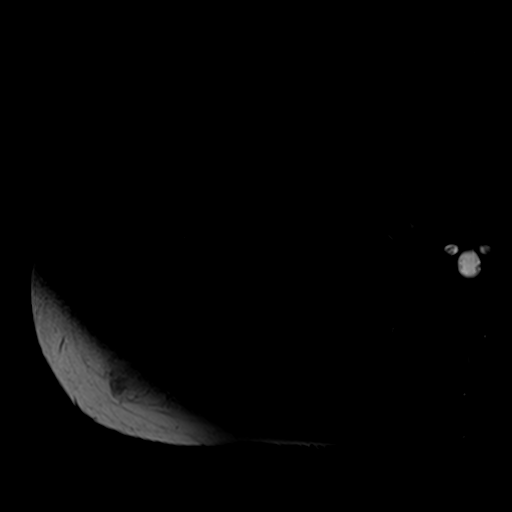
[im 12/20]
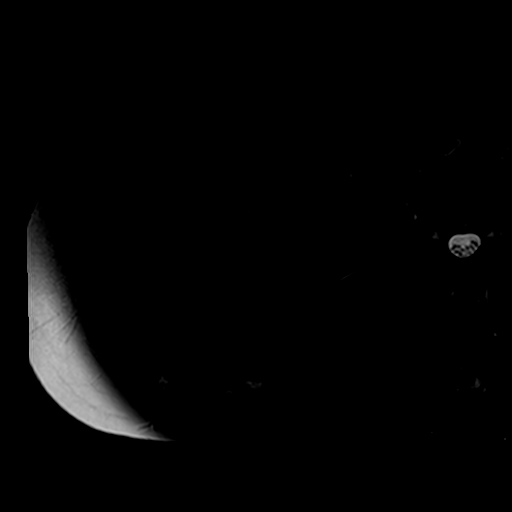
[im 20/20]
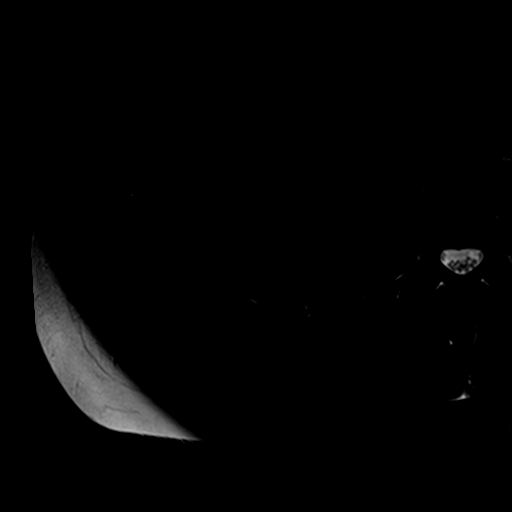

[Series 10: STIR · coronal · 5.0mm · 0.94mm/px · 5 of 15 slices shown]
[im 1/15]
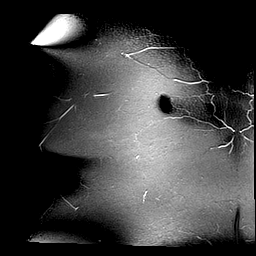
[im 4/15]
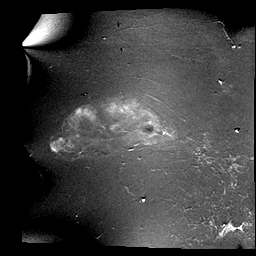
[im 8/15]
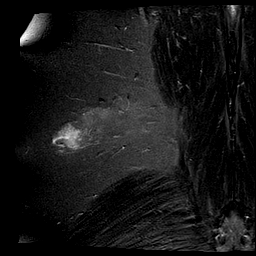
[im 11/15]
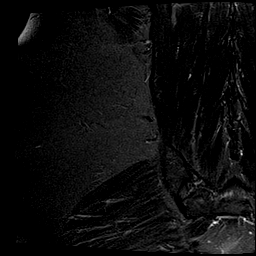
[im 15/15]
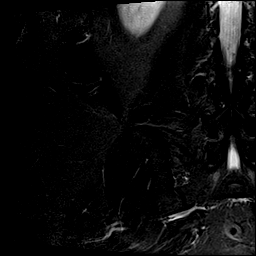

[22 of 48 positions shown; findings below may reference images not displayed]

FINDINGS: Soft tissues

Within the right flank soft tissues, there is a 5.2 x 12.5 x 5.0 cm
encapsulated, predominantly fatty mass containing several areas of
patchy, enhancing nodularity, most prominently laterally and
inferiorly. Findings are concerning for well-differentiated
liposarcoma. There is also a 0.9 x 1.8 x 1.3 cm area of focal rim
calcified fat in the medial aspect of the mass, possibly
representing fat necrosis.

Visualized intra-abdominal contents are unremarkable.

Bones/Joint/Cartilage

No focal marrow signal abnormality in the visualized portions of the
lumbar spine and right iliac bone.

Muscles and Tendons

Visualized paraspinous muscles are unremarkable.
IMPRESSION: 1. Large 5.2 x 12.5 x 5.0 cm encapsulated, predominantly fatty mass
within the right flank, containing several areas of patchy,
enhancing nodularity, concerning for well-differentiated
liposarcoma. Focal area of rim calcified fat within the medial
aspect of the mass may represent internal fat necrosis.

These results will be called to the ordering clinician or
representative by the Radiologist Assistant, and communication
documented in the PACS or zVision Dashboard.

## 2019-03-05 ENCOUNTER — Telehealth: Payer: Self-pay | Admitting: Plastic Surgery

## 2019-03-05 NOTE — Telephone Encounter (Signed)
Called patient to confirm appointment scheduled for tomorrow. Patient answered the following questions: °1.Has the patient traveled outside of the state of [place] at all within the past 6 weeks? No °2.Does the patient have a fever or cough at all? No °3.Has the patient been tested for COVID? Had a positive COVID test? No °4. Has the patient been in contact with anyone who has tested positive? No ° °

## 2019-03-06 ENCOUNTER — Encounter: Payer: Self-pay | Admitting: Plastic Surgery

## 2019-03-06 ENCOUNTER — Other Ambulatory Visit: Payer: Self-pay

## 2019-03-06 ENCOUNTER — Ambulatory Visit: Payer: BC Managed Care – PPO | Admitting: Plastic Surgery

## 2019-03-06 DIAGNOSIS — L819 Disorder of pigmentation, unspecified: Secondary | ICD-10-CM | POA: Diagnosis not present

## 2019-03-06 NOTE — Progress Notes (Signed)
     Patient ID: Haley Roach, female    DOB: 1971-07-20, 48 y.o.   MRN: [id number]   Chief Complaint  Patient presents with  . Skin Problem    check mole the of nose    The patient is a 48 yrs old wf here for evaluation of several changing skin lesions.  She noticed them several years ago but they have been getting larger and darker over the past few months.  She has a very positive family history of melanoma.  The one on her nose is 1 cm in size, raised, hyperpigmented and slightly irregular.  The one on her left ear is at the antitragus, 1 cm in size, raised and hyperpigmented.  There is another on the upper lip but is not pigmented.  No other lesions noted. No cervical lymphadenopathy noted.     Review of Systems  Constitutional: Negative.   HENT: Negative.   Eyes: Negative.   Respiratory: Negative.   Cardiovascular: Negative.   Gastrointestinal: Negative.   Endocrine: Negative.   Genitourinary: Negative.   Musculoskeletal: Negative.   Neurological: Negative.   Hematological: Negative.   Psychiatric/Behavioral: Negative.     Past Medical History:  Diagnosis Date  . Asthma    exercise induced asthma as a teenager  . Headache    migraines  . Hypertension   . PONV (postoperative nausea and vomiting)    after tonsillectomy    Past Surgical History:  Procedure Laterality Date  . 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 TONSILLECTOMY    . uterine ablation    . WISDOM TOOTH EXTRACTION        Current Outpatient Medications:  .  amitriptyline (ELAVIL) 25 MG tablet, Take 25 mg by mouth at bedtime., Disp: , Rfl:  .  atorvastatin (LIPITOR) 20 MG tablet, Take 20 mg by mouth at bedtime., Disp: , Rfl:  .  metoprolol succinate (TOPROL-XL) 50 MG 24 hr tablet, TK 1 T PO ONCE D, Disp: , Rfl:    Objective:   Vitals:   03/06/19 0825  BP:  (!) 167/111  Pulse: 100  Temp: 98.7 F (37.1 C)  SpO2: 100%    Physical Exam Vitals signs and nursing note reviewed.  Constitutional:      Appearance: Normal appearance.  HENT:     Head: Normocephalic and atraumatic.     Mouth/Throat:     Mouth: Mucous membranes are moist.  Eyes:     Extraocular Movements: Extraocular movements intact.  Cardiovascular:     Rate and Rhythm: Normal rate.  Pulmonary:     Effort: Pulmonary effort is normal.  Abdominal:     General: Abdomen is flat.  Neurological:     General: No focal deficit present.     Mental Status: She is alert.  Psychiatric:        Mood and Affect: Mood normal.        Behavior: Behavior normal.     Assessment & Plan:  Changing pigmented skin lesion   Recommend excision of the left ear and nose changing skin lesion.  [place], DO

## 2019-03-22 ENCOUNTER — Telehealth: Payer: Self-pay | Admitting: Plastic Surgery

## 2019-03-22 NOTE — Telephone Encounter (Signed)
Called patient to confirm appointment scheduled for tomorrow. Patient answered the following questions: °1.Has the patient traveled outside of the state of [place] at all within the past 6 weeks? No °2.Does the patient have a fever or cough at all? No °3.Has the patient been tested for COVID? Had a positive COVID test? No °4. Has the patient been in contact with anyone who has tested positive? No ° °

## 2019-03-23 ENCOUNTER — Ambulatory Visit: Payer: BC Managed Care – PPO | Admitting: Plastic Surgery

## 2019-03-23 ENCOUNTER — Other Ambulatory Visit: Payer: Self-pay

## 2019-03-23 ENCOUNTER — Other Ambulatory Visit (HOSPITAL_COMMUNITY)
Admission: RE | Admit: 2019-03-23 | Discharge: 2019-03-23 | Disposition: A | Discharge status: Home / Self Care | Payer: BC Managed Care – PPO | Source: Ambulatory Visit | Attending: Plastic Surgery | Admitting: Plastic Surgery

## 2019-03-23 ENCOUNTER — Encounter: Payer: Self-pay | Admitting: Plastic Surgery

## 2019-03-23 VITALS — BP 163/114 | HR 100 | Temp 98.3°F | Ht 64.0 in | Wt 189.2 lb

## 2019-03-23 DIAGNOSIS — L819 Disorder of pigmentation, unspecified: Secondary | ICD-10-CM | POA: Diagnosis present

## 2019-03-23 NOTE — Progress Notes (Signed)
Preoperative Dx: changing pigmented skin lesion of nose and left ear  Postoperative Dx: Same  Procedure: Excision of pigmented changing skin lesion of nose 1.5 cm and left ear 1.7 cm  Surgeon: Dr. Lyndee Leo Tamy Accardo  Anesthesia: Lidocaine 1% with 1:100,000 epinepherine  Indication for Procedure: pigmented skin lesions  Description of Procedure: Risks and complications were explained to the patient.  Consent was confirmed and signed.  Time out was called and all information was confirmed to be correct.  The area was prepped with betadine and drapped.    Left ear:  Lidocaine 1% with epinepherine was injected in the subcutaneous area.  After waiting several minutes for the lidocaine to take affect a #15 blade was used to excise the 1.7 cm area in an eliptical pattern.  The skin edges were reapproximated with 5-0 Monocryl simple and vertical mattress sutures.  Derma bond was applied.  The patient is to follow up in one week.  She tolerated the procedure well and there were no complications. The specimen was sent to pathology.  Nose: Lidocaine 1% with epinepherine was injected in the subcutaneous area.  After waiting several minutes for the lidocaine to take affect a #15 blade was used to excise the 1.5 cm area in an eliptical pattern.  The skin edges were reapproximated with 5-0 Monocryl simple interrupted and vertical mattress sutures.  Steri strips were applied.  The patient is to follow up in one week.  She tolerated the procedure well and there were no complications. The specimen was sent to pathology.

## 2019-03-29 ENCOUNTER — Telehealth: Payer: Self-pay | Admitting: Plastic Surgery

## 2019-03-29 NOTE — Telephone Encounter (Signed)
Called patient to confirm appointment scheduled for tomorrow. Patient answered the following questions: °1.Has the patient traveled outside of the state of [place] at all within the past 6 weeks? No °2.Does the patient have a fever or cough at all? No °3.Has the patient been tested for COVID? Had a positive COVID test? No °4. Has the patient been in contact with anyone who has tested positive? No ° °

## 2019-03-30 ENCOUNTER — Other Ambulatory Visit: Payer: Self-pay

## 2019-03-30 ENCOUNTER — Ambulatory Visit (INDEPENDENT_AMBULATORY_CARE_PROVIDER_SITE_OTHER): Payer: BC Managed Care – PPO | Admitting: Plastic Surgery

## 2019-03-30 ENCOUNTER — Encounter: Payer: Self-pay | Admitting: Plastic Surgery

## 2019-03-30 VITALS — BP 147/97 | HR 85 | Temp 98.7°F | Ht 64.0 in | Wt 189.0 lb

## 2019-03-30 DIAGNOSIS — L819 Disorder of pigmentation, unspecified: Secondary | ICD-10-CM

## 2019-03-30 NOTE — Progress Notes (Signed)
The patient is a 48 year old female here for follow-up after excision of a lesion on her nose and left ear.  Path showed a melanocytic nevus.  The ear is healing nicely and sutures are still in place.  The nose opened up.  I removed the sutures today.  There is no sign of infection.  I cleaned it up and placed donated a cell.  Patient is to place [facility] daily with [place].  I like to see her back in a week and will remove the ear sutures.

## 2019-04-05 ENCOUNTER — Telehealth: Payer: Self-pay | Admitting: Plastic Surgery

## 2019-04-05 NOTE — Telephone Encounter (Signed)
Called patient to confirm appointment scheduled for tomorrow. Patient answered the following questions: °1.Has the patient traveled outside of the state of [place] at all within the past 6 weeks? No °2.Does the patient have a fever or cough at all? No °3.Has the patient been tested for COVID? Had a positive COVID test? No °4. Has the patient been in contact with anyone who has tested positive? No ° °

## 2019-04-06 ENCOUNTER — Other Ambulatory Visit: Payer: Self-pay

## 2019-04-06 ENCOUNTER — Ambulatory Visit: Payer: BC Managed Care – PPO | Admitting: Plastic Surgery

## 2019-04-06 ENCOUNTER — Encounter: Payer: Self-pay | Admitting: Plastic Surgery

## 2019-04-06 VITALS — BP 136/93 | HR 89 | Temp 98.1°F | Ht 64.0 in | Wt 189.0 lb

## 2019-04-06 DIAGNOSIS — L819 Disorder of pigmentation, unspecified: Secondary | ICD-10-CM

## 2019-04-06 NOTE — Progress Notes (Signed)
Patient is a 48 year old female here for follow-up on excision of changing skin lesions of her nose and ear.  All areas are healing nicely.  She has been putting the donated a cell on her nose.  It is filling in very nicely.  Her ear is completely healed we took the Steri-Strips out sutures out.  No sign of infection in either location.  She is pleased with her progress.  I like to see her back in 2 weeks.

## 2019-04-23 ENCOUNTER — Telehealth: Payer: Self-pay | Admitting: Plastic Surgery

## 2019-04-23 NOTE — Telephone Encounter (Signed)

## 2019-04-24 ENCOUNTER — Ambulatory Visit: Payer: BC Managed Care – PPO | Admitting: Plastic Surgery

## 2019-04-24 ENCOUNTER — Encounter: Payer: Self-pay | Admitting: Plastic Surgery

## 2019-04-24 ENCOUNTER — Other Ambulatory Visit: Payer: Self-pay

## 2019-04-24 VITALS — BP 140/87 | HR 96 | Temp 98.9°F | Ht 64.0 in | Wt 195.0 lb

## 2019-04-24 DIAGNOSIS — L819 Disorder of pigmentation, unspecified: Secondary | ICD-10-CM

## 2019-04-24 NOTE — Progress Notes (Signed)
The patient is a 48 year old female here for follow-up after excision of the lesion on her nose.  She is completely healed and is thrilled with her result.  She is completely epithelialized.  There is a little bit of redness but otherwise not even a detectable scar on her nose.  Her ear is doing beautifully and is completely healed as well.  She is getting ready to go across country to bring her sister back to the [place].  She will come see me when she gets back.

## 2020-01-21 ENCOUNTER — Emergency Department (HOSPITAL_BASED_OUTPATIENT_CLINIC_OR_DEPARTMENT_OTHER): Payer: BC Managed Care – PPO

## 2020-01-21 ENCOUNTER — Encounter (HOSPITAL_BASED_OUTPATIENT_CLINIC_OR_DEPARTMENT_OTHER): Payer: Self-pay | Admitting: Emergency Medicine

## 2020-01-21 ENCOUNTER — Other Ambulatory Visit: Payer: Self-pay

## 2020-01-21 ENCOUNTER — Emergency Department (HOSPITAL_BASED_OUTPATIENT_CLINIC_OR_DEPARTMENT_OTHER)
Admission: EM | Admit: 2020-01-21 | Discharge: 2020-01-21 | Disposition: A | Discharge status: Home / Self Care | Payer: BC Managed Care – PPO | Attending: Emergency Medicine | Admitting: Emergency Medicine

## 2020-01-21 DIAGNOSIS — R109 Unspecified abdominal pain: Secondary | ICD-10-CM | POA: Diagnosis present

## 2020-01-21 DIAGNOSIS — I10 Essential (primary) hypertension: Secondary | ICD-10-CM | POA: Diagnosis not present

## 2020-01-21 DIAGNOSIS — N200 Calculus of kidney: Secondary | ICD-10-CM | POA: Insufficient documentation

## 2020-01-21 DIAGNOSIS — J45909 Unspecified asthma, uncomplicated: Secondary | ICD-10-CM | POA: Insufficient documentation

## 2020-01-21 DIAGNOSIS — Z79899 Other long term (current) drug therapy: Secondary | ICD-10-CM | POA: Diagnosis not present

## 2020-01-21 LAB — CBC WITH DIFFERENTIAL/PLATELET
Abs Immature Granulocytes: 0.08 10*3/uL — ABNORMAL HIGH (ref 0.00–0.07)
Basophils Absolute: 0.1 10*3/uL (ref 0.0–0.1)
Basophils Relative: 1 %
Eosinophils Absolute: 0 10*3/uL (ref 0.0–0.5)
Eosinophils Relative: 0 %
HCT: 44.7 % (ref 36.0–46.0)
Hemoglobin: 15.2 g/dL — ABNORMAL HIGH (ref 12.0–15.0)
Immature Granulocytes: 1 %
Lymphocytes Relative: 11 %
Lymphs Abs: 1.2 10*3/uL (ref 0.7–4.0)
MCH: 29.7 pg (ref 26.0–34.0)
MCHC: 34 g/dL (ref 30.0–36.0)
MCV: 87.3 fL (ref 80.0–100.0)
Monocytes Absolute: 0.7 10*3/uL (ref 0.1–1.0)
Monocytes Relative: 6 %
Neutro Abs: 9 10*3/uL — ABNORMAL HIGH (ref 1.7–7.7)
Neutrophils Relative %: 81 %
Platelets: 344 10*3/uL (ref 150–400)
RBC: 5.12 MIL/uL — ABNORMAL HIGH (ref 3.87–5.11)
RDW: 12.9 % (ref 11.5–15.5)
WBC: 11.2 10*3/uL — ABNORMAL HIGH (ref 4.0–10.5)
nRBC: 0 % (ref 0.0–0.2)

## 2020-01-21 LAB — URINALYSIS, ROUTINE W REFLEX MICROSCOPIC
Bilirubin Urine: NEGATIVE
Glucose, UA: 100 mg/dL — AB
Ketones, ur: NEGATIVE mg/dL
Leukocytes,Ua: NEGATIVE
Nitrite: POSITIVE — AB
Protein, ur: NEGATIVE mg/dL
Specific Gravity, Urine: 1.02 (ref 1.005–1.030)
pH: 6 (ref 5.0–8.0)

## 2020-01-21 LAB — URINALYSIS, MICROSCOPIC (REFLEX)

## 2020-01-21 LAB — BASIC METABOLIC PANEL
Anion gap: 11 (ref 5–15)
BUN: 15 mg/dL (ref 6–20)
CO2: 23 mmol/L (ref 22–32)
Calcium: 8.8 mg/dL — ABNORMAL LOW (ref 8.9–10.3)
Chloride: 100 mmol/L (ref 98–111)
Creatinine, Ser: 0.9 mg/dL (ref 0.44–1.00)
GFR calc Af Amer: >60 mL/min (ref >60)
GFR calc non Af Amer: >60 mL/min (ref >60)
Glucose, Bld: 136 mg/dL — ABNORMAL HIGH (ref 70–99)
Potassium: 3.2 mmol/L — ABNORMAL LOW (ref 3.5–5.1)
Sodium: 134 mmol/L — ABNORMAL LOW (ref 135–145)

## 2020-01-21 LAB — PREGNANCY, URINE: Preg Test, Ur: NEGATIVE

## 2020-01-21 MED ORDER — SODIUM CHLORIDE 0.9 % IV BOLUS
1000.0000 mL | Freq: Once | INTRAVENOUS | Status: AC
  Administered 2020-01-21: 1000 mL via INTRAVENOUS

## 2020-01-21 MED ORDER — ONDANSETRON HCL 4 MG/2ML IJ SOLN
4.0000 mg | Freq: Once | INTRAMUSCULAR | Status: AC
  Administered 2020-01-21: 06:00:00 4 mg via INTRAVENOUS
  Filled 2020-01-21: qty 2

## 2020-01-21 MED ORDER — CEPHALEXIN 500 MG PO CAPS: 500.0000 mg | ORAL_CAPSULE | Freq: Three times a day (TID) | ORAL | 0 refills | Status: AC

## 2020-01-21 MED ORDER — MORPHINE SULFATE (PF) 4 MG/ML IV SOLN
4.0000 mg | Freq: Once | INTRAVENOUS | Status: AC
  Administered 2020-01-21: 06:00:00 4 mg via INTRAVENOUS
  Filled 2020-01-21: qty 1

## 2020-01-21 MED ORDER — KETOROLAC TROMETHAMINE 15 MG/ML IJ SOLN
15.0000 mg | Freq: Once | INTRAMUSCULAR | Status: AC
  Administered 2020-01-21: 07:00:00 15 mg via INTRAVENOUS
  Filled 2020-01-21: qty 1

## 2020-01-21 MED ORDER — TAMSULOSIN HCL 0.4 MG PO CAPS: 0.4000 mg | ORAL_CAPSULE | Freq: Every day | ORAL | 0 refills | Status: DC

## 2020-01-21 NOTE — ED Notes (Signed)
Discharge instructions given at length. Voiced understanding. Urine strainer given. Assisted pt to car.

## 2020-01-21 NOTE — ED Provider Notes (Signed)
  Provider Note MRN:  [id number]  Arrival date & time: 01/21/20    ED Course and Medical Decision Making  Assumed care from Dr. Dina Rich at shift change.  First-time kidney stone, 7 mm, moderate hydronephrosis, upon reassessment patient's pain is much better after Toradol, she is feeling well enough to go home.  Urinalysis showing nitrite positivity, rare bacteria, but basically no WBCs.  Infected kidney stone thought to be unlikely given patient's normal vital signs, no fever, duration of illness.  However given the nitrite positivity urology was consulted.  Spoke with Dr. Claudia Desanctis of [facility], recommending Keflex, Flomax, outpatient follow-up.  Procedures  Final Clinical Impressions(s) / ED Diagnoses     ICD-10-CM   1. Kidney stone  N20.0     ED Discharge Orders    None      Discharge Instructions   None     Barth Kirks. [place] Small, [place] [email or web address]    Maudie Flakes, MD 01/21/20 [id number]

## 2020-01-21 NOTE — Discharge Instructions (Addendum)
You were evaluated in the Emergency Department and after careful evaluation, we did not find any emergent condition requiring admission or further testing in the hospital.  Your exam/testing today is overall reassuring.  Your symptoms seem to be due to a kidney stone.  As discussed, please take the Keflex antibiotic and the Flomax medication as directed.  Please return to the Emergency Department if you experience any worsening of your condition.  We encourage you to follow up with a primary care provider.  Thank you for allowing [place] to be a part of your care.

## 2020-01-21 NOTE — ED Triage Notes (Signed)
Reports having UTI sx 3 wks ago, took AZO at home and felt relief. Sx returned on Thursday, again took AZO and again got relief. This morning woke up with R flank pain, and vomiting x 3. Also reports urinary frequency. Denies fevers.

## 2020-01-21 NOTE — ED Notes (Signed)
Pt up to bathroom without difficulty.

## 2020-01-21 NOTE — ED Provider Notes (Signed)
[facility] EMERGENCY DEPARTMENT Provider Note   CSN: [id number] Arrival date & time: 01/21/20  0501     History Chief Complaint  Patient presents with  . Flank Pain    Haley Roach is a 49 y.o. female.  HPI     This is a 49 year old female with a history of hypertension who presents with right flank pain and urinary frequency and urgency.  Patient reports over the last several weeks she has intermittently had symptoms of frequency and urgency.  She initially took Azo with some relief.  However, over the last 1 to 2 days she has had worsening lower abdominal pressure, frequency, urgency and right-sided flank pain.  She states at times it improves with Azo and ibuprofen.  She occasionally has an urge to have a bowel movement.  She has had several episodes of nonbilious, nonbloody emesis.  She denies dysuria or fevers.  No known history of kidney stones.  Currently she rates her pain at 5 out of 10.  Past Medical History:  Diagnosis Date  . Asthma    exercise induced asthma as a teenager  . Headache    migraines  . Hypertension   . PONV (postoperative nausea and vomiting)    after tonsillectomy    Patient Active Problem List   Diagnosis Date Noted  . Changing pigmented skin lesion 03/06/2019    Past Surgical History:  Procedure Laterality Date  . EXCISION OF KELOID Right 03/16/2018   Procedure: EXCISION OF LOWER RIGHT BACK MASS;  Surgeon: Stark Klein, MD;  Location: [facility];  Service: General;  Laterality: Right;  . TONSILLECTOMY    . uterine ablation    . WISDOM TOOTH EXTRACTION       OB History   No obstetric history on file.     Family History  Problem Relation Age of Onset  . Hypertension Mother     Social History   Tobacco Use  . Smoking status: Never Smoker  . Smokeless tobacco: Never Used  Substance Use Topics  . Alcohol use: Yes    Comment: occasionally  . Drug use: Never    Home Medications Prior to Admission  medications   Medication Sig Start Date End Date Taking? Authorizing Provider  amitriptyline (ELAVIL) 25 MG tablet Take 25 mg by mouth at bedtime.    [provider]  atorvastatin (LIPITOR) 20 MG tablet Take 20 mg by mouth at bedtime.    [provider]  metoprolol succinate (TOPROL-XL) 50 MG 24 hr tablet TK 1 T PO ONCE D 01/11/19   [provider]    Allergies    Codeine  Review of Systems   Review of Systems  Constitutional: Negative for fever.  Respiratory: Negative for shortness of breath.   Cardiovascular: Negative for chest pain.  Gastrointestinal: Positive for nausea and vomiting.  Genitourinary: Positive for flank pain, frequency and urgency. Negative for dysuria and hematuria.  All other systems reviewed and are negative.   Physical Exam Updated Vital Signs BP (!) 189/105 (BP Location: Right Arm)   Pulse 89   Temp 98.3 F (36.8 C) (Oral)   Resp 17   Ht 1.6 m (5\' 3" )   Wt 93 kg   SpO2 99%   BMI 36.31 kg/m   Physical Exam Vitals and nursing note reviewed.  Constitutional:      Appearance: She is well-developed. She is obese.     Comments: Uncomfortable appearing but nontoxic  HENT:     Head:  Normocephalic and atraumatic.     Mouth/Throat:     Mouth: Mucous membranes are moist.  Eyes:     Pupils: Pupils are equal, round, and reactive to light.  Cardiovascular:     Rate and Rhythm: Normal rate and regular rhythm.     Heart sounds: Normal heart sounds.  Pulmonary:     Effort: Pulmonary effort is normal. No respiratory distress.     Breath sounds: No wheezing.  Abdominal:     General: Bowel sounds are normal.     Palpations: Abdomen is soft.     Tenderness: There is no right CVA tenderness or left CVA tenderness.  Musculoskeletal:     Cervical back: Neck supple.     Right lower leg: No edema.     Left lower leg: No edema.  Skin:    General: Skin is warm and dry.  Neurological:     Mental Status: She is alert and oriented to  person, place, and time.  Psychiatric:        Mood and Affect: Mood normal.     ED Results / Procedures / Treatments   Labs (all labs ordered are listed, but only abnormal results are displayed) Labs Reviewed  URINALYSIS, ROUTINE W REFLEX MICROSCOPIC - Abnormal; Notable for the following components:      Result Value   Color, Urine ORANGE (*)    APPearance HAZY (*)    Glucose, UA 100 (*)    Hgb urine dipstick MODERATE (*)    Nitrite POSITIVE (*)    All other components within normal limits  CBC WITH DIFFERENTIAL/PLATELET - Abnormal; Notable for the following components:   WBC 11.2 (*)    RBC 5.12 (*)    Hemoglobin 15.2 (*)    Neutro Abs 9.0 (*)    Abs Immature Granulocytes 0.08 (*)    All other components within normal limits  BASIC METABOLIC PANEL - Abnormal; Notable for the following components:   Sodium 134 (*)    Potassium 3.2 (*)    Glucose, Bld 136 (*)    Calcium 8.8 (*)    All other components within normal limits  URINALYSIS, MICROSCOPIC (REFLEX) - Abnormal; Notable for the following components:   Bacteria, UA RARE (*)    All other components within normal limits  URINE CULTURE  PREGNANCY, URINE    EKG None  Radiology CT Renal Stone Study  Result Date: 01/21/2020 CLINICAL DATA:  Right-sided flank pain EXAM: CT ABDOMEN AND PELVIS WITHOUT CONTRAST TECHNIQUE: Multidetector CT imaging of the abdomen and pelvis was performed following the standard protocol without IV contrast. COMPARISON:  None. FINDINGS: Lower chest: The visualized heart size within normal limits. No pericardial fluid/thickening. No hiatal hernia. The visualized portions of the lungs are clear. Hepatobiliary: Although limited due to the lack of intravenous contrast, normal in appearance without gross focal abnormality. No evidence of calcified gallstones or biliary ductal dilatation. Pancreas:  Unremarkable.  No surrounding inflammatory changes. Spleen: Normal in size. Although limited due to the lack  of intravenous contrast, normal in appearance. Adrenals/Urinary Tract: Both adrenal glands appear normal. There is moderate right pelvicaliectasis and ureterectasis down to the level of the UVJ where there is a 7 mm calculus. There is significant right-sided perinephric and proximal ureteral inflammatory changes. No left-sided renal or collecting system calculi. The bladder is unremarkable. Stomach/Bowel: The stomach, small bowel, and colon are normal in appearance. No inflammatory changes or obstructive findings. appendix is normal. Vascular/Lymphatic: There are no enlarged abdominal or pelvic  lymph nodes. Scattered aortic atherosclerotic calcifications are seen without aneurysmal dilatation. Reproductive: The uterus and adnexa are unremarkable. Other: No evidence of abdominal wall mass or hernia. Musculoskeletal: No acute or significant osseous findings. IMPRESSION: 7 mm right UVJ calculus causing moderate right hydronephrosis with perinephric/periureteral stranding. Electronically Signed   By: Prudencio Pair M.D.   On: 01/21/2020 06:25    Procedures Procedures (including critical care time)  Medications Ordered in ED Medications  sodium chloride 0.9 % bolus 1,000 mL (1,000 mLs Intravenous New Bag/Given 01/21/20 0558)  morphine 4 MG/ML injection 4 mg (4 mg Intravenous Given 01/21/20 0602)  ondansetron (ZOFRAN) injection 4 mg (4 mg Intravenous Given 01/21/20 0602)  ketorolac (TORADOL) 15 MG/ML injection 15 mg (15 mg Intravenous Given 01/21/20 [id number])    ED Course  I have reviewed the triage vital signs and the nursing notes.  Pertinent labs & imaging results that were available during my care of the patient were reviewed by me and considered in my medical decision making (see chart for details).  Clinical Course as of Jan 21 644  Mon Jan 21, 2020  [id number] Pain mostly unchanged after morphine.  Patient advised to diagnosis of kidney stone.  Discussed with her plan of care including pain control.  If able to  be pain controlled, she will be discharged with pain and nausea medication and urology follow-up.  If unable to control pain, will consult urology.  Patient is agreeable with plan.  Creatinine reassuring.  Will dose with Toradol.   [CH]    Clinical Course User Index [CH] Danija Gosa, Barbette Hair, MD   MDM Rules/Calculators/A&P                       Patient presents with waxing and waning right-sided flank pain.  Also with urinary symptoms.  She is overall nontoxic and vital signs notable for blood pressure 189/105.  She is afebrile.  Considerations include kidney stone, pyelonephritis.  Less likely appendicitis.  Patient was given pain and nausea medication as well as fluids.  Lab work obtained.  Urinalysis with moderate hemoglobin and positive nitrite.  Likely related to Azo use.  No obvious infection.  CT stone study obtained and shows a 7 mm obstructing kidney stone on the right with some perinephric stranding.  See clinical course above.  Patient was redosed pain medication with Toradol.  We will plan for expectant management if patient can be pain control.  At this time she does not be infected or septic.  Patient signed out to oncoming provider.  Final Clinical Impression(s) / ED Diagnoses Final diagnoses:  Kidney stone    Rx / DC Orders ED Discharge Orders    None       Merryl Hacker, MD 01/21/20 [phone]

## 2020-01-22 LAB — URINE CULTURE: Culture: <10000 — AB

## 2020-01-24 ENCOUNTER — Other Ambulatory Visit: Payer: Self-pay | Admitting: Urology

## 2020-01-24 DIAGNOSIS — N200 Calculus of kidney: Secondary | ICD-10-CM

## 2020-01-28 ENCOUNTER — Other Ambulatory Visit (HOSPITAL_COMMUNITY)
Admission: RE | Admit: 2020-01-28 | Discharge: 2020-01-28 | Disposition: A | Discharge status: Home / Self Care | Payer: BC Managed Care – PPO | Source: Ambulatory Visit | Attending: Urology | Admitting: Urology

## 2020-01-28 DIAGNOSIS — Z01812 Encounter for preprocedural laboratory examination: Secondary | ICD-10-CM | POA: Diagnosis present

## 2020-01-28 DIAGNOSIS — Z20822 Contact with and (suspected) exposure to covid-19: Secondary | ICD-10-CM | POA: Insufficient documentation

## 2020-01-28 LAB — SARS CORONAVIRUS 2 (TAT 6-24 HRS): SARS Coronavirus 2: NEGATIVE

## 2020-01-29 NOTE — H&P (Signed)
CC: Right ureteral calculus  HPI:  49 year old female with a 7 mm distal right ureteral calculus with upstream hydronephrosis. She went to the emergency department on 01/21/2020. CT scan showed the distal right ureteral calculus. This is her 1st stone. She denies any fever, chill, nausea, vomiting. Pain is currently controlled.     ALLERGIES: Codeine - Vomiting    MEDICATIONS: Metoprolol Succinate 50 mg tablet, extended release 24 hr  Tamsulosin Hcl 0.4 mg capsule  Amitriptyline Hcl 25 mg tablet  Atorvastatin Calcium 20 mg tablet  Cephalexin     GU PSH: No GU PSH      PSH Notes: lipoma mass excision 2019, uterine ablation 2013   NON-GU PSH: No Non-GU PSH    After a thorough review of the management options for the patient's condition the patient  elected to proceed with surgical therapy as noted above. We have discussed the potential benefits and risks of the procedure, side effects of the proposed treatment, the likelihood of the patient achieving the goals of the procedure, and any potential problems that might occur during the procedure or recuperation. Informed consent has been obtained. GU PMH: None   NON-GU PMH: Hypercholesterolemia Hypertension    FAMILY HISTORY: 1 Daughter - Daughter 1 son - Son   SOCIAL HISTORY: Marital Status: Married Current Smoking Status: Patient has never smoked.   Tobacco Use Assessment Completed: Used Tobacco in last 30 days? Has never drank.  Drinks 1 caffeinated drink per day. Patient's occupation Community education officer.    REVIEW OF SYSTEMS:    GU Review Female:   Patient reports frequent urination, hard to postpone urination, get up at night to urinate, and leakage of urine. Patient denies burning /pain with urination, stream starts and stops, trouble starting your stream, have to strain to urinate, and being pregnant.  Gastrointestinal (Upper):   Patient reports nausea and vomiting. Patient denies indigestion/ heartburn.  Gastrointestinal  (Lower):   Patient reports diarrhea and constipation.   Constitutional:   Patient denies fever, night sweats, weight loss, and fatigue.  Skin:   Patient denies skin rash/ lesion and itching.  Eyes:   Patient denies blurred vision and double vision.  Ears/ Nose/ Throat:   Patient denies sore throat and sinus problems.  Hematologic/Lymphatic:   Patient denies swollen glands and easy bruising.  Cardiovascular:   Patient denies leg swelling and chest pains.  Respiratory:   Patient reports cough and shortness of breath.   Endocrine:   Patient reports excessive thirst.   Musculoskeletal:   Patient reports back pain. Patient denies joint pain.  Neurological:   Patient reports dizziness. Patient denies headaches.  Psychologic:   Patient denies depression and anxiety.   Notes: blood in urine    VITAL SIGNS:      01/24/2020 09:59 AM  Weight 203 lb / 92.08 kg  Height 63 in / 160.02 cm  BP 177/107 mmHg  Pulse 111 /min  Temperature 98.4 F / 36.8 C  BMI 36.0 kg/m   MULTI-SYSTEM PHYSICAL EXAMINATION:    Constitutional: Well-nourished. No physical deformities. Normally developed. Good grooming.  Respiratory: No labored breathing, no use of accessory muscles.   Cardiovascular: Normal temperature, normal extremity pulses, no swelling, no varicosities.  Skin: No paleness, no jaundice, no cyanosis. No lesion, no ulcer, no rash.  Neurologic / Psychiatric: Oriented to time, oriented to place, oriented to person. No depression, no anxiety, no agitation.  Gastrointestinal: No mass, no tenderness, no rigidity, non obese abdomen.  Eyes: Normal conjunctivae. Normal eyelids.  Musculoskeletal: Normal gait and station of head and neck.     PAST DATA REVIEWED:  Source Of History:  Patient  Records Review:   Previous Doctor Records, Previous Patient Records  X-Ray Review: C.T. Abdomen/Pelvis: Reviewed Films. Reviewed Report. Discussed With Patient.     PROCEDURES:         KUB - [id number]  A single view of  the abdomen is obtained.  Distal right ureteral calculus redemonstrated      Patient confirmed No Neulasta OnPro Device.           Urinalysis w/Scope Dipstick Dipstick Cont'd Micro  Color: Yellow Bilirubin: Neg mg/dL WBC/hpf: NS (Not Seen)  Appearance: Clear Ketones: Neg mg/dL RBC/hpf: 3 - 10/hpf  Specific Gravity: <=1.005 Blood: 3+ ery/uL Bacteria: NS (Not Seen)  pH: <=5.0 Protein: Neg mg/dL Cystals: NS (Not Seen)  Glucose: Neg mg/dL Urobilinogen: 0.2 mg/dL Casts: NS (Not Seen)    Nitrites: Neg Trichomonas: Not Present    Leukocyte Esterase: Neg leu/uL Mucous: Not Present      Epithelial Cells: 0 - 5/hpf      Yeast: NS (Not Seen)      Sperm: Not Present    ASSESSMENT:      ICD-10 Details  1 GU:   Ureteral calculus - N20.1 Right, Undiagnosed New Problem  2   Ureteral obstruction secondary to calculous - N13.2 Right, Undiagnosed New Problem   PLAN:           Orders Labs Urine Culture  X-Rays: KUB          Schedule         Document Letter(s):  Created for Patient: Clinical Summary         Notes:   We discussed the management of urinary stones. These options include observation, ureteroscopy, and shockwave lithotripsy. We discussed which options are relevant to these particular stones. We discussed the natural history of stones as well as the complications of untreated stones and the impact on quality of life without treatment as well as with each of the above listed treatments. We also discussed the efficacy of each treatment in its ability to clear the stone burden. With any of these management options I discussed the signs and symptoms of infection and the need for emergent treatment should these be experienced. For each option we discussed the ability of each procedure to clear the patient of their stone burden.   For observation I described the risks which include but are not limited to silent renal damage, life-threatening infection, need for emergent surgery, failure  to pass stone, and pain.   For ureteroscopy I described the risks which include heart attack, stroke, pulmonary embolus, death, bleeding, infection, damage to contiguous structures, positioning injury, ureteral stricture, ureteral avulsion, ureteral injury, need for ureteral stent, inability to perform ureteroscopy, need for an interval procedure, inability to clear stone burden, stent discomfort and pain.   For shockwave lithotripsy I described the risks which include arrhythmia, kidney contusion, kidney hemorrhage, need for transfusion, pain, inability to break up stone, inability to pass stone fragments, Steinstrasse, infection associated with obstructing stones, need for different surgical procedure, need for repeat shockwave lithotripsy.   She would like to proceed with ESWL.

## 2020-01-31 ENCOUNTER — Ambulatory Visit (HOSPITAL_BASED_OUTPATIENT_CLINIC_OR_DEPARTMENT_OTHER)
Admission: RE | Admit: 2020-01-31 | Discharge: 2020-01-31 | Disposition: A | Discharge status: Home / Self Care | Payer: BC Managed Care – PPO | Attending: Urology | Admitting: Urology

## 2020-01-31 ENCOUNTER — Other Ambulatory Visit: Payer: Self-pay

## 2020-01-31 ENCOUNTER — Encounter (HOSPITAL_BASED_OUTPATIENT_CLINIC_OR_DEPARTMENT_OTHER): Payer: Self-pay | Admitting: Urology

## 2020-01-31 ENCOUNTER — Ambulatory Visit (HOSPITAL_COMMUNITY): Payer: BC Managed Care – PPO

## 2020-01-31 ENCOUNTER — Encounter (HOSPITAL_BASED_OUTPATIENT_CLINIC_OR_DEPARTMENT_OTHER)
Admission: RE | Disposition: A | Discharge status: Home / Self Care | Payer: Self-pay | Source: Home / Self Care | Attending: Urology

## 2020-01-31 DIAGNOSIS — N132 Hydronephrosis with renal and ureteral calculous obstruction: Secondary | ICD-10-CM | POA: Diagnosis not present

## 2020-01-31 DIAGNOSIS — I10 Essential (primary) hypertension: Secondary | ICD-10-CM | POA: Diagnosis not present

## 2020-01-31 DIAGNOSIS — N200 Calculus of kidney: Secondary | ICD-10-CM

## 2020-01-31 DIAGNOSIS — E7800 Pure hypercholesterolemia, unspecified: Secondary | ICD-10-CM | POA: Insufficient documentation

## 2020-01-31 DIAGNOSIS — Z885 Allergy status to narcotic agent status: Secondary | ICD-10-CM | POA: Insufficient documentation

## 2020-01-31 DIAGNOSIS — N201 Calculus of ureter: Secondary | ICD-10-CM

## 2020-01-31 DIAGNOSIS — Z79899 Other long term (current) drug therapy: Secondary | ICD-10-CM | POA: Insufficient documentation

## 2020-01-31 HISTORY — PX: EXTRACORPOREAL SHOCK WAVE LITHOTRIPSY: SHX1557

## 2020-01-31 SURGERY — LITHOTRIPSY, ESWL
Anesthesia: LOCAL | Laterality: Right

## 2020-01-31 MED ORDER — CIPROFLOXACIN HCL 500 MG PO TABS
ORAL_TABLET | ORAL | Status: AC
  Filled 2020-01-31: qty 1

## 2020-01-31 MED ORDER — DIAZEPAM 5 MG PO TABS
ORAL_TABLET | ORAL | Status: AC
  Filled 2020-01-31: qty 2

## 2020-01-31 MED ORDER — CIPROFLOXACIN HCL 500 MG PO TABS
500.0000 mg | ORAL_TABLET | ORAL | Status: AC
  Administered 2020-01-31: 09:00:00 500 mg via ORAL
  Filled 2020-01-31: qty 1

## 2020-01-31 MED ORDER — DIPHENHYDRAMINE HCL 25 MG PO CAPS
25.0000 mg | ORAL_CAPSULE | ORAL | Status: AC
  Administered 2020-01-31: 09:00:00 25 mg via ORAL
  Filled 2020-01-31: qty 1

## 2020-01-31 MED ORDER — DIAZEPAM 5 MG PO TABS
10.0000 mg | ORAL_TABLET | ORAL | Status: AC
  Administered 2020-01-31: 10 mg via ORAL
  Filled 2020-01-31: qty 2

## 2020-01-31 MED ORDER — DIPHENHYDRAMINE HCL 25 MG PO CAPS
ORAL_CAPSULE | ORAL | Status: AC
  Filled 2020-01-31: qty 1

## 2020-01-31 MED ORDER — SODIUM CHLORIDE 0.9 % IV SOLN
INTRAVENOUS | Status: DC
  Filled 2020-01-31: qty 1000

## 2020-01-31 NOTE — Interval H&P Note (Signed)
History and Physical Interval Note:  01/31/2020 9:31 AM  Haley Roach  has presented today for surgery, with the diagnosis of RIGHT URETERAL STONE.  The various methods of treatment have been discussed with the patient and family. After consideration of risks, benefits and other options for treatment, the patient has consented to  Procedure(s): EXTRACORPOREAL SHOCK WAVE LITHOTRIPSY (ESWL) (Right) as a surgical intervention.  The patient's history has been reviewed, patient examined, no change in status, stable for surgery.  I have reviewed the patient's chart and labs.  Questions were answered to the patient's satisfaction.     Milo Schreier A Kianni Lheureux

## 2020-01-31 NOTE — Discharge Instructions (Signed)
I have reviewed discharge instructions in detail with the patient. They will follow-up with me or their physician as scheduled. My nurse will also be calling the patients as per protocol.  Post Anesthesia Home Care Instructions  Activity: Get plenty of rest for the remainder of the day. A responsible adult should stay with you for 24 hours following the procedure.  For the next 24 hours, DO NOT: -Drive a car -Paediatric nurse -Drink alcoholic beverages -Take any medication unless instructed by your physician -Make any legal decisions or sign important papers.  Meals: Start with liquid foods such as gelatin or soup. Progress to regular foods as tolerated. Avoid greasy, spicy, heavy foods. If nausea and/or vomiting occur, drink only clear liquids until the nausea and/or vomiting subsides. Call your physician if vomiting continues.  Special Instructions/Symptoms: Your throat may feel dry or sore from the anesthesia or the breathing tube placed in your throat during surgery. If this causes discomfort, gargle with warm salt water. The discomfort should disappear within 24 hours.  If you had a scopolamine patch placed behind your ear for the management of post- operative nausea and/or vomiting:  1. The medication in the patch is effective for 72 hours, after which it should be removed.  Wrap patch in a tissue and discard in the trash. Wash hands thoroughly with soap and water. 2. You may remove the patch earlier than 72 hours if you experience unpleasant side effects which may include dry mouth, dizziness or visual disturbances. 3. Avoid touching the patch. Wash your hands with soap and water after contact with the patch.    Lithotripsy, Care After This sheet gives you information about how to care for yourself after your procedure. Your health care provider may also give you more specific instructions. If you have problems or questions, contact your health care provider. What can I expect  after the procedure? After the procedure, it is common to have:  Some blood in your urine. This should only last for a few days.  Soreness in your back, sides, or upper abdomen for a few days.  Blotches or bruises on your back where the pressure wave entered the skin.  Pain, discomfort, or nausea when pieces (fragments) of the kidney stone move through the tube that carries urine from the kidney to the bladder (ureter). Stone fragments may pass soon after the procedure, but they may continue to pass for up to 4-8 weeks. ? If you have severe pain or nausea, contact your health care provider. This may be caused by a large stone that was not broken up, and this may mean that you need more treatment.  Some pain or discomfort during urination.  Some pain or discomfort in the lower abdomen or (in men) at the base of the penis. Follow these instructions at home: Medicines  Take over-the-counter and prescription medicines only as told by your health care provider.  If you were prescribed an antibiotic medicine, take it as told by your health care provider. Do not stop taking the antibiotic even if you start to feel better.  Do not drive for 24 hours if you were given a medicine to help you relax (sedative).  Do not drive or use heavy machinery while taking prescription pain medicine. Eating and drinking      Drink enough water and fluids to keep your urine clear or pale yellow. This helps any remaining pieces of the stone to pass. It can also help prevent new stones from forming.  Eat plenty of fresh fruits and vegetables.  Follow instructions from your health care provider about eating and drinking restrictions. You may be instructed: ? To reduce how much salt (sodium) you eat or drink. Check ingredients and nutrition facts on packaged foods and beverages. ? To reduce how much meat you eat.  Eat the recommended amount of calcium for your age and gender. Ask your health care provider  how much calcium you should have. General instructions  Get plenty of rest.  Most people can resume normal activities 1-2 days after the procedure. Ask your health care provider what activities are safe for you.  Your health care provider may direct you to lie in a certain position (postural drainage) and tap firmly (percuss) over your kidney area to help stone fragments pass. Follow instructions as told by your health care provider.  If directed, strain all urine through the strainer that was provided by your health care provider. ? Keep all fragments for your health care provider to see. Any stones that are found may be sent to a medical lab for examination. The stone may be as small as a grain of salt.  Keep all follow-up visits as told by your health care provider. This is important. Contact a health care provider if:  You have pain that is severe or does not get better with medicine.  You have nausea that is severe or does not go away.  You have blood in your urine longer than your health care provider told you to expect.  You have more blood in your urine.  You have pain during urination that does not go away.  You urinate more frequently than usual and this does not go away.  You develop a rash or any other possible signs of an allergic reaction. Get help right away if:  You have severe pain in your back, sides, or upper abdomen.  You have severe pain while urinating.  Your urine is very dark red.  You have blood in your stool (feces).  You cannot pass any urine at all.  You feel a strong urge to urinate after emptying your bladder.  You have a fever or chills.  You develop shortness of breath, difficulty breathing, or chest pain.  You have severe nausea that leads to persistent vomiting.  You faint. Summary  After this procedure, it is common to have some pain, discomfort, or nausea when pieces (fragments) of the kidney stone move through the tube that  carries urine from the kidney to the bladder (ureter). If this pain or nausea is severe, however, you should contact your health care provider.  Most people can resume normal activities 1-2 days after the procedure. Ask your health care provider what activities are safe for you.  Drink enough water and fluids to keep your urine clear or pale yellow. This helps any remaining pieces of the stone to pass, and it can help prevent new stones from forming.  If directed, strain your urine and keep all fragments for your health care provider to see. Fragments or stones may be as small as a grain of salt.  Get help right away if you have severe pain in your back, sides, or upper abdomen or have severe pain while urinating. This information is not intended to replace advice given to you by your health care provider. Make sure you discuss any questions you have with your health care provider. Document Revised: 02/19/2019 Document Reviewed: 09/29/2016 Elsevier Patient Education  2020 [employer].

## 2020-11-24 IMAGING — DX DG ABDOMEN 1V
2 series · 2 of 2 positions shown · non-contrast
Comparison: Radiograph 01/24/2020. CT 01/21/2020

CLINICAL DATA: Right ureteral calculus, patient for lithotripsy
today.

EXAM:
ABDOMEN - 1 VIEW

[abdomen kub (1 of 2)]
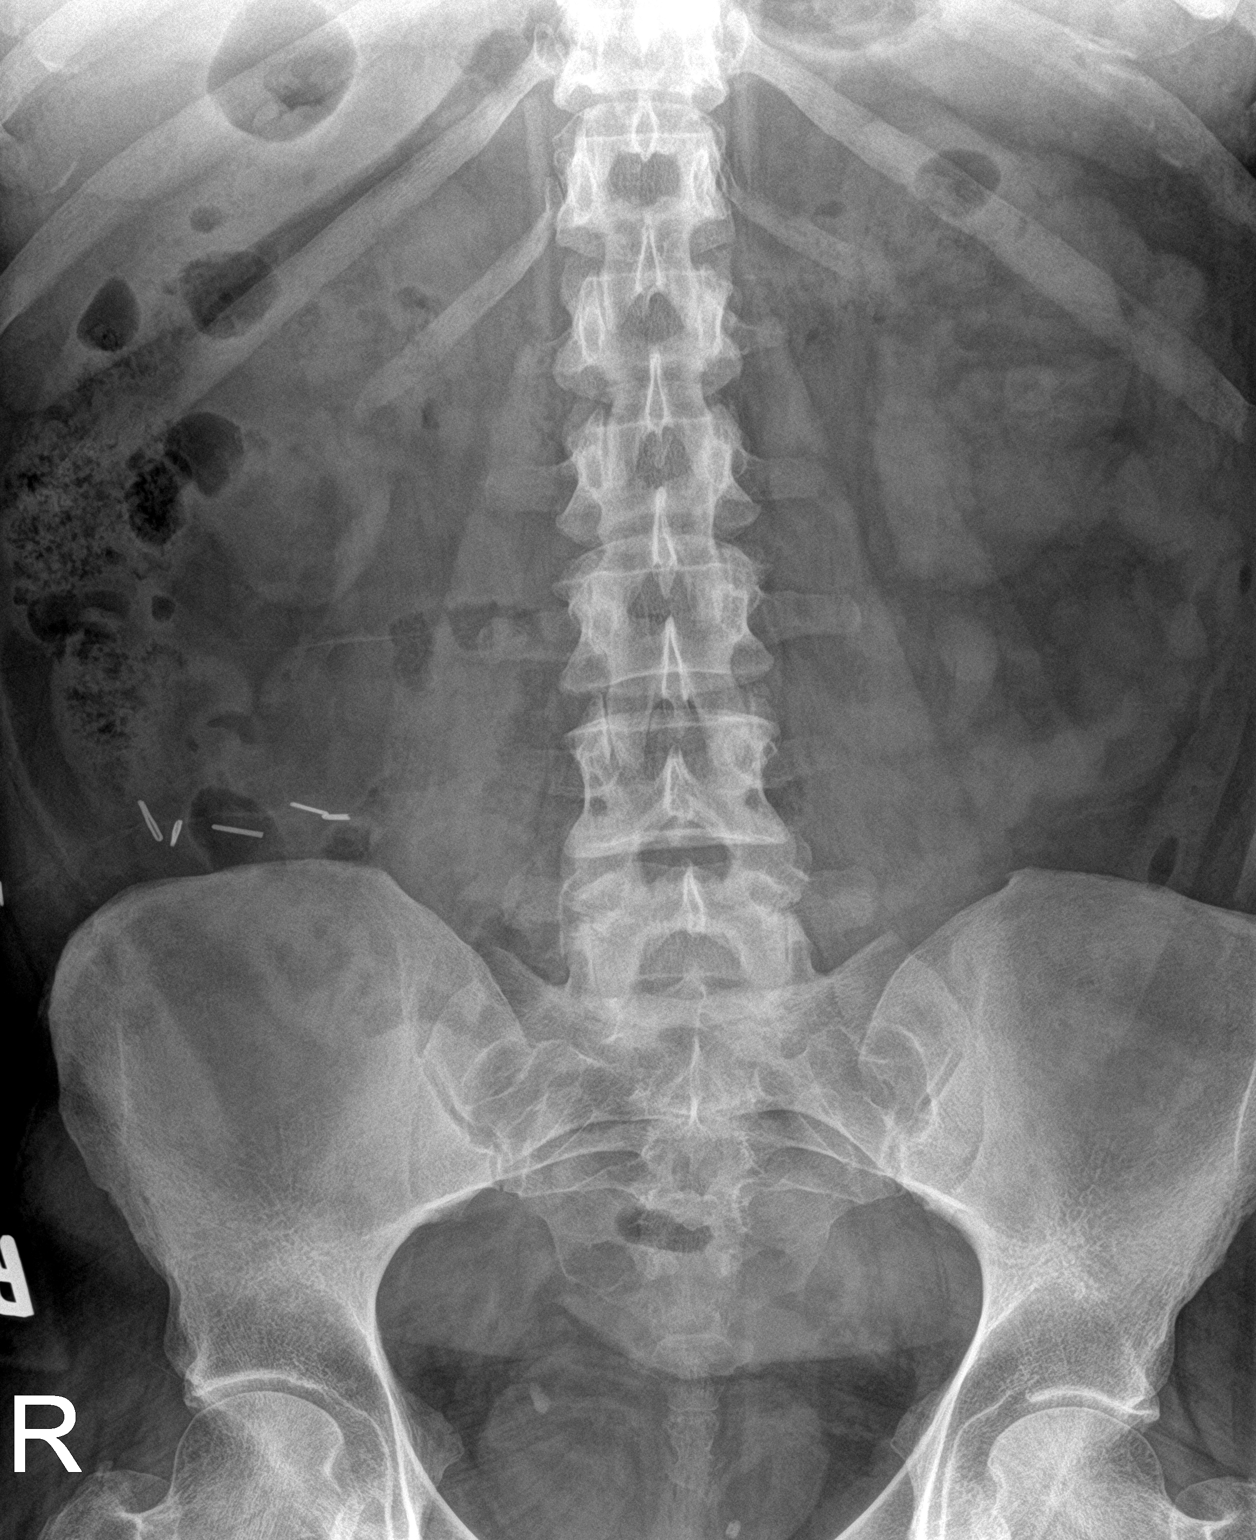

[abdomen kub (2 of 2)]
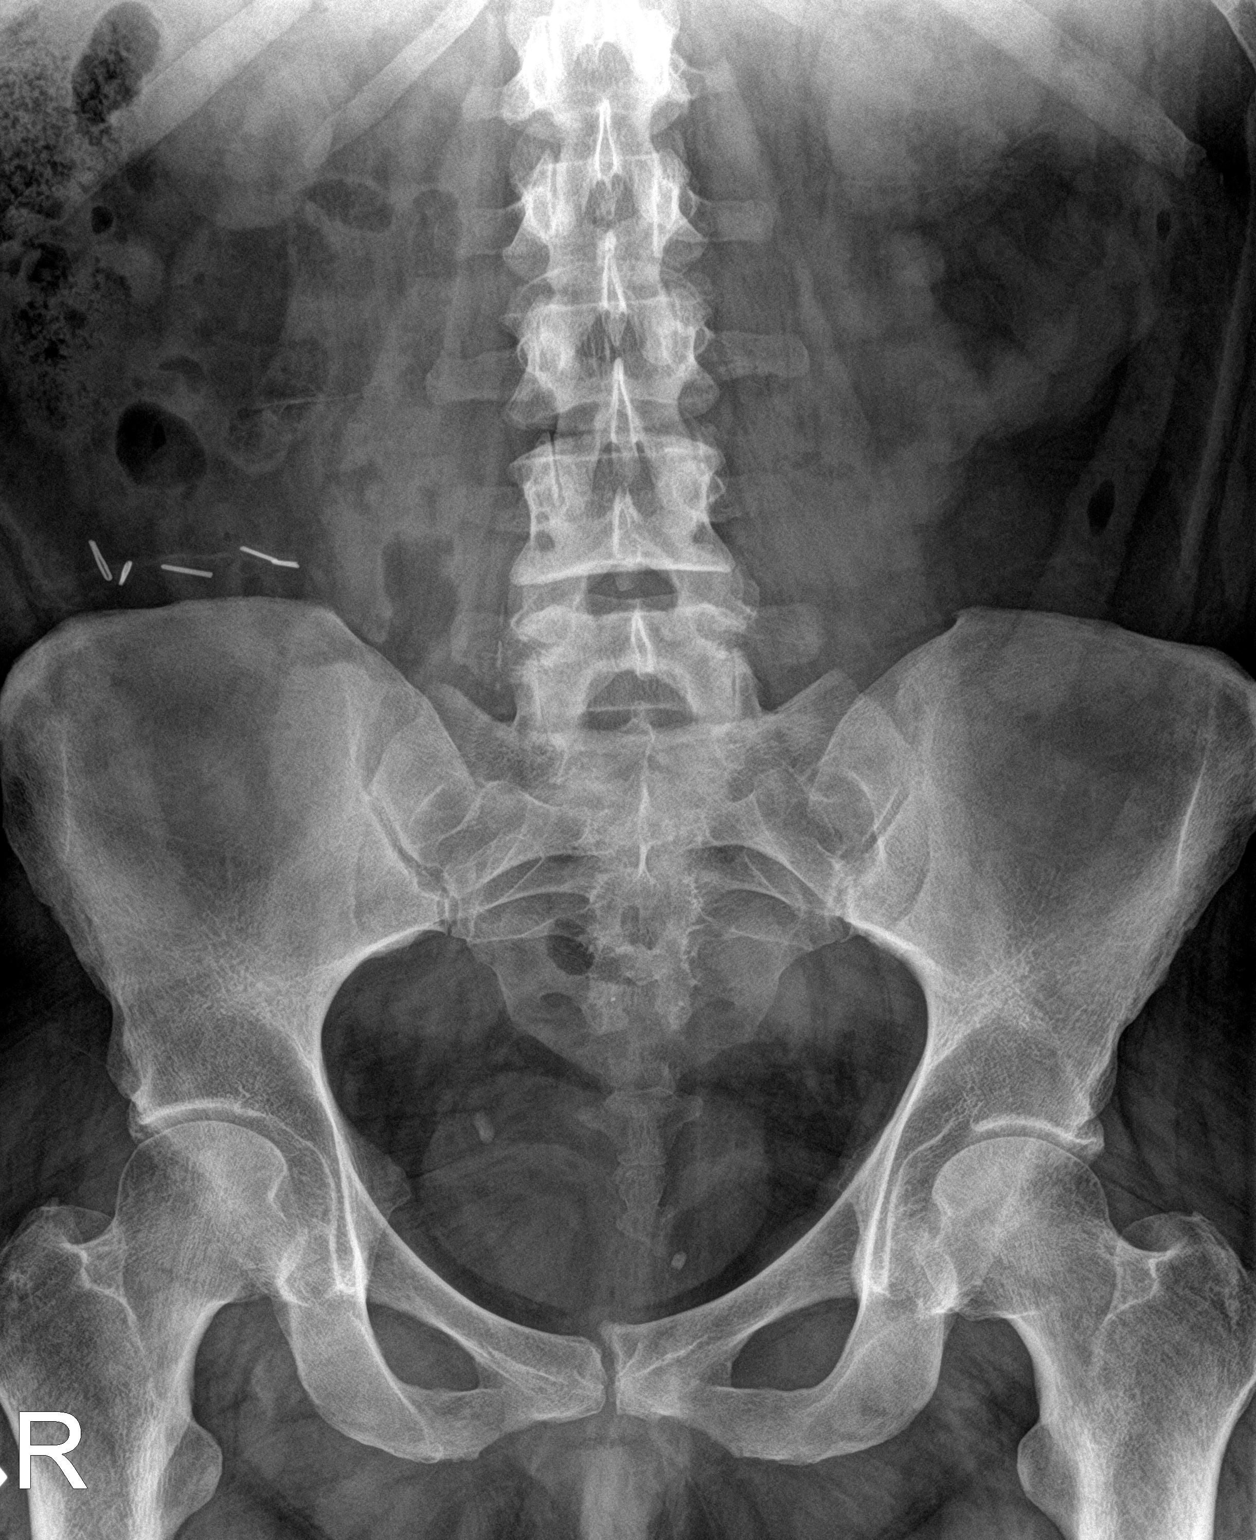

[2 of 2 positions shown; findings below may reference images not displayed]

FINDINGS: 7 mm stone in the right pelvis is unchanged from prior exam and
consistent with ureteral calculus. Left pelvic calcification is
unchanged and represents a phlebolith on CT. No additional stones
project over either renal bed. Surgical clips in the right mid
abdomen or in the posterior subcutaneous tissues on CT. Normal bowel
gas pattern. No acute osseous abnormalities are seen.
IMPRESSION: Unchanged 7 mm right pelvic calcification corresponding to distal
ureteral calculus on CT.

## 2024-01-05 ENCOUNTER — Other Ambulatory Visit (HOSPITAL_BASED_OUTPATIENT_CLINIC_OR_DEPARTMENT_OTHER): Payer: Self-pay | Admitting: Family Medicine

## 2024-01-05 DIAGNOSIS — R140 Abdominal distension (gaseous): Secondary | ICD-10-CM

## 2024-01-06 ENCOUNTER — Ambulatory Visit (HOSPITAL_BASED_OUTPATIENT_CLINIC_OR_DEPARTMENT_OTHER)
Admission: RE | Admit: 2024-01-06 | Discharge: 2024-01-06 | Disposition: A | Discharge status: Home / Self Care | Payer: Self-pay | Source: Ambulatory Visit | Attending: Family Medicine | Admitting: Family Medicine

## 2024-01-06 DIAGNOSIS — R140 Abdominal distension (gaseous): Secondary | ICD-10-CM | POA: Diagnosis present

## 2024-01-10 ENCOUNTER — Telehealth: Payer: Self-pay

## 2024-01-10 NOTE — Telephone Encounter (Signed)
 Spoke with Haley Roach regarding her referral to GYN oncology. She has an appointment scheduled with Dr. Alvester Morin on 01/23/24 at 9:00. Patient agrees to date and time. She has been provided with office address and location. She is also aware of our mask and visitor policy. Patient verbalized understanding and will call with any questions.

## 2024-01-20 ENCOUNTER — Encounter: Payer: Self-pay | Admitting: Psychiatry

## 2024-01-21 ENCOUNTER — Emergency Department (HOSPITAL_BASED_OUTPATIENT_CLINIC_OR_DEPARTMENT_OTHER)
Admission: EM | Admit: 2024-01-21 | Discharge: 2024-01-21 | Disposition: A | Discharge status: Home / Self Care | Attending: Emergency Medicine | Admitting: Emergency Medicine

## 2024-01-21 ENCOUNTER — Encounter (HOSPITAL_BASED_OUTPATIENT_CLINIC_OR_DEPARTMENT_OTHER): Payer: Self-pay | Admitting: Emergency Medicine

## 2024-01-21 ENCOUNTER — Other Ambulatory Visit: Payer: Self-pay

## 2024-01-21 DIAGNOSIS — W208XXA Other cause of strike by thrown, projected or falling object, initial encounter: Secondary | ICD-10-CM | POA: Insufficient documentation

## 2024-01-21 DIAGNOSIS — S0101XA Laceration without foreign body of scalp, initial encounter: Secondary | ICD-10-CM | POA: Insufficient documentation

## 2024-01-21 MED ORDER — LIDOCAINE-EPINEPHRINE (PF) 2 %-1:200000 IJ SOLN
10.0000 mL | Freq: Once | INTRAMUSCULAR | Status: AC
  Administered 2024-01-21: 10 mL
  Filled 2024-01-21: qty 20

## 2024-01-21 NOTE — ED Provider Notes (Signed)
 [place] [facility] Provider Note   CSN: [id number] Arrival date & time: 01/21/24  1715     History  Chief Complaint  Patient presents with   Laceration    Haley Roach is a 53 y.o. female.  Patient presents emergency department for evaluation of scalp laceration.  Injury occurred around 2 PM.  Patient was cutting limbs off of the tree.  A limb fell and struck the back of her head.  No loss of consciousness.  No subsequent confusion, headache, vomiting.  No neck pain or weakness in the arms of the legs.  Patient cleaned the wound, rinsed it in the shower, applied Neosporin.  Tetanus up-to-date.      Home Medications Prior to Admission medications   Medication Sig Start Date End Date Taking? Authorizing Provider  amitriptyline (ELAVIL) 25 MG tablet Take 25 mg by mouth at bedtime.    [provider]  atorvastatin (LIPITOR) 20 MG tablet Take 20 mg by mouth at bedtime.    [provider]  furosemide (LASIX) 20 MG tablet Take 20 mg by mouth daily. 01/07/24   [provider]  hydrOXYzine (ATARAX) 25 MG tablet Take 25 mg by mouth daily as needed. 01/06/24   [provider]  metoprolol succinate (TOPROL-XL) 50 MG 24 hr tablet TK 1 T PO ONCE D 01/11/19   [provider]  Probiotic Product (PROBIOTIC DAILY PO) Take by mouth.    [provider]  senna (SENOKOT) 8.6 MG tablet Take 1 tablet by mouth daily.    [provider]      Allergies    Codeine    Review of Systems   Review of Systems  Physical Exam Updated Vital Signs BP (!) 180/85 (BP Location: Right Arm)   Pulse (!) 120   Temp 97.9 F (36.6 C)   Resp 16   Ht 5\' 4"  (1.626 m)   Wt 84.8 kg   SpO2 100%   BMI 32.10 kg/m   Physical Exam Vitals and nursing note reviewed.  Constitutional:      Appearance: She is well-developed.  HENT:     Head: Normocephalic. No raccoon eyes or Battle's sign.     Comments: 2 cm  laceration, moderately gaping, clean, hemostatic to the left temporal area of the scalp    Right Ear: External ear normal.     Left Ear: External ear normal.     Nose: Nose normal.     Mouth/Throat:     Pharynx: Uvula midline.  Eyes:     General: Lids are normal.     Extraocular Movements:     Right eye: No nystagmus.     Left eye: No nystagmus.     Conjunctiva/sclera: Conjunctivae normal.     Pupils: Pupils are equal, round, and reactive to light.     Comments: No visible hyphema noted  Pulmonary:     Effort: Pulmonary effort is normal.     Breath sounds: Normal breath sounds.  Abdominal:     Palpations: Abdomen is soft.     Tenderness: There is no abdominal tenderness.  Musculoskeletal:     Cervical back: Normal range of motion and neck supple. No tenderness or bony tenderness.     Thoracic back: No tenderness or bony tenderness.     Lumbar back: No tenderness or bony tenderness.  Skin:    General: Skin is warm and dry.  Neurological:     Mental Status: She is  alert and oriented to person, place, and time.     GCS: GCS eye subscore is 4. GCS verbal subscore is 5. GCS motor subscore is 6.     Cranial Nerves: No cranial nerve deficit.     Sensory: No sensory deficit.     Coordination: Coordination normal.     ED Results / Procedures / Treatments   Labs (all labs ordered are listed, but only abnormal results are displayed) Labs Reviewed - No data to display  EKG None  Radiology No results found.  Procedures .Laceration Repair  Date/Time: 01/21/2024 7:40 PM  Performed by: Renne Crigler, PA-C Authorized by: Renne Crigler, PA-C   Consent:    Consent obtained:  Verbal   Consent given by:  Patient   Risks discussed:  Infection and pain   Alternatives discussed:  No treatment Universal protocol:    Patient identity confirmed:  Verbally with patient and provided demographic data Anesthesia:    Anesthesia method:  Local infiltration   Local anesthetic:  Lidocaine  2% WITH epi Laceration details:    Location:  Scalp   Length (cm):  2 Exploration:    Imaging outcome: foreign body not noted     Wound exploration: wound explored through full range of motion and entire depth of wound visualized     Wound extent: no foreign body   Treatment:    Area cleansed with:  Shur-Clens   Amount of cleaning:  Standard Skin repair:    Repair method:  Staples   Number of staples:  2 Approximation:    Approximation:  Close Post-procedure details:    Dressing:  Open (no dressing)   Procedure completion:  Tolerated well, no immediate complications     Medications Ordered in ED Medications  lidocaine-EPINEPHrine (XYLOCAINE W/EPI) 2 %-1:200000 (PF) injection 10 mL (has no administration in time range)    ED Course/ Medical Decision Making/ A&P    Patient seen and examined. History obtained directly from patient.   Labs/EKG: None ordered  Imaging: None ordered  Medications/Fluids: Ordered: Lidocaine.   Most recent vital signs reviewed and are as follows: BP (!) 180/85 (BP Location: Right Arm)   Pulse (!) 120   Temp 97.9 F (36.6 C)   Resp 16   Ht 5\' 4"  (1.626 m)   Wt 84.8 kg   SpO2 100%   BMI 32.10 kg/m   Initial impression: Scalp laceration  7:41 PM Reassessment performed. Patient appears comfortable. Exam unchanged.  Wound closed with 2 staples as documented above.  Most current vital signs reviewed and are as follows: BP (!) 153/92 (BP Location: Left Arm)   Pulse (!) 120   Temp 97.9 F (36.6 C)   Resp 17   Ht 5\' 4"  (1.626 m)   Wt 84.8 kg   SpO2 98%   BMI 32.10 kg/m   Heart rate recheck 105  Plan: Discharge to home.   Prescriptions written for: None  Other home care instructions discussed: Patient counseled on wound care.    ED return instructions discussed: Patient was urged to return to the Emergency Department urgently with worsening pain, swelling, expanding erythema especially if it streaks away from the affected area,  fever, or if they have any other concerns.   Follow-up instructions discussed: Patient counseled on need to return or see PCP/urgent care for staple removal in 7 days.  Medical Decision Making Risk Prescription drug management.   Patient with head injury, scalp laceration.  This was repaired.  This was well cleaned prior to arrival.  No foreign bodies visualized.  Depth was full-thickness.  Patient without any concussion symptoms.  Negative Canadian head CT rules.  No indication for imaging at this time.  Tetanus up-to-date.        Final Clinical Impression(s) / ED Diagnoses Final diagnoses:  Laceration of scalp, initial encounter    Rx / DC Orders ED Discharge Orders     None         Renne Crigler, PA-C 01/21/24 1942    Sloan Leiter, DO 01/22/24 2231

## 2024-01-21 NOTE — Discharge Instructions (Signed)
 Please read and follow all provided instructions.  Your diagnoses today include:  1. Laceration of scalp, initial encounter    Tests performed today include: Vital signs. See below for your results today.   Medications prescribed:  Please use over-the-counter NSAID medications (ibuprofen, naproxen) or Tylenol (acetaminophen) as directed on the packaging for pain -- as long as you do not have any reasons avoid these medications. Reasons to avoid NSAID medications include: weak kidneys, a history of bleeding in your stomach or gut, or uncontrolled high blood pressure or previous heart attack. Reasons to avoid Tylenol include: liver problems or ongoing alcohol use. Never take more than 4000mg  or 8 Extra strength Tylenol in a 24 hour period.     Take any prescribed medications only as directed.   Home care instructions:  Follow any educational materials and wound care instructions contained in this packet.   Keep affected area above the level of your heart when possible to minimize swelling. Wash area gently twice a day with warm soapy water. Do not apply alcohol or hydrogen peroxide. Cover the area if it draining or weeping.   Follow-up instructions: Suture Removal: Return to the Emergency Department or see your primary care care doctor in 7 days for a recheck of your wound and removal of your sutures or staples.    Return instructions:  Return to the Emergency Department if you have: Fever Worsening pain Worsening swelling of the wound Pus draining from the wound Redness of the skin that moves away from the wound, especially if it streaks away from the affected area  Any other emergent concerns  Your vital signs today were: BP (!) 153/92 (BP Location: Left Arm)   Pulse (!) 120   Temp 97.9 F (36.6 C)   Resp 17   Ht 5\' 4"  (1.626 m)   Wt 84.8 kg   SpO2 98%   BMI 32.10 kg/m  If your blood pressure (BP) was elevated above 135/85 this visit, please have this repeated by your doctor  within one month. --------------

## 2024-01-21 NOTE — ED Triage Notes (Signed)
 Pt was pruning a crepe myrtle today when a branch hit her; she then fell backward; has laceration to posterior LT side scalp; she is not sure if it is from the branch or something on the ground; no LOC

## 2024-01-23 ENCOUNTER — Encounter: Payer: Self-pay | Admitting: Psychiatry

## 2024-01-23 ENCOUNTER — Inpatient Hospital Stay

## 2024-01-23 ENCOUNTER — Inpatient Hospital Stay: Payer: 59 | Attending: Psychiatry | Admitting: Psychiatry

## 2024-01-23 ENCOUNTER — Inpatient Hospital Stay: Admitting: Gynecologic Oncology

## 2024-01-23 ENCOUNTER — Other Ambulatory Visit (HOSPITAL_COMMUNITY)
Admission: RE | Admit: 2024-01-23 | Discharge: 2024-01-23 | Disposition: A | Discharge status: Home / Self Care | Source: Ambulatory Visit

## 2024-01-23 VITALS — BP 132/80 | HR 92

## 2024-01-23 VITALS — BP 169/88 | HR 100 | Temp 98.3°F | Resp 19 | Ht 64.0 in | Wt 186.8 lb

## 2024-01-23 DIAGNOSIS — K5900 Constipation, unspecified: Secondary | ICD-10-CM | POA: Insufficient documentation

## 2024-01-23 DIAGNOSIS — R1900 Intra-abdominal and pelvic swelling, mass and lump, unspecified site: Secondary | ICD-10-CM

## 2024-01-23 DIAGNOSIS — R188 Other ascites: Secondary | ICD-10-CM | POA: Diagnosis not present

## 2024-01-23 DIAGNOSIS — R978 Other abnormal tumor markers: Secondary | ICD-10-CM | POA: Diagnosis not present

## 2024-01-23 DIAGNOSIS — R971 Elevated cancer antigen 125 [CA 125]: Secondary | ICD-10-CM | POA: Insufficient documentation

## 2024-01-23 DIAGNOSIS — Z79899 Other long term (current) drug therapy: Secondary | ICD-10-CM | POA: Insufficient documentation

## 2024-01-23 DIAGNOSIS — R6881 Early satiety: Secondary | ICD-10-CM | POA: Diagnosis not present

## 2024-01-23 DIAGNOSIS — N951 Menopausal and female climacteric states: Secondary | ICD-10-CM | POA: Insufficient documentation

## 2024-01-23 DIAGNOSIS — Z801 Family history of malignant neoplasm of trachea, bronchus and lung: Secondary | ICD-10-CM | POA: Diagnosis not present

## 2024-01-23 LAB — LACTATE DEHYDROGENASE: LDH: 322 U/L — ABNORMAL HIGH (ref 98–192)

## 2024-01-23 LAB — CEA (ACCESS): CEA (CHCC): 1 ng/mL (ref 0.00–5.00)

## 2024-01-23 NOTE — H&P (View-Only) (Signed)
 GYNECOLOGIC ONCOLOGY NEW PATIENT CONSULTATION  Date of Service: 01/23/2024 Referring Provider: Jarrett Soho, PA-C [address] [place],  [place] [address]   ASSESSMENT AND PLAN: Haley Roach is a 53 y.o. woman with 27cm cystic and solid abdominopelvic mass.  We reviewed that the exact etiology of the pelvic mass is unclear, but could include a benign, borderline, or malignant process.  The recommended treatment is surgical excision to make a definitive diagnosis.  Given the size of the mass, we will start with a diagnostic laparoscopy.  If no overt concerns for cancer, will consider mini laparotomy for controlled cyst drainage.  Reviewed risk of cyst spillage with minimally invasive approach versus laparotomy, but if no overt concerns for malignancy, reviewed benefits of minimally invasive approach versus large laparotomy.  Patient in agreement with this plan.  Given that she is at the average age of menopause and has had hot flashes for 2 years, reviewed the risks and benefits of contralateral salpingo-oophorectomy at time of surgery.  We will proceed with BSO.  In the event of malignancy or borderline tumor on frozen section, we will perform indicated staging procedures. We discussed that these procedures may include hysterectomy, omentectomy pelvic and/or para-aortic lymphadenectomy, peritoneal biopsies. We would also remove any tissue concerning for metastatic disease which could require additional procedures including bowel surgery.  Patient was consented for: Diagnostic laparoscopy, mini laparotomy for controlled cyst drainage, robotic assisted bilateral salpingo-oophorectomy, possible hysterectomy and staging, possible laparotomy on 03/06/24.  Will let patient know if earlier surgical date becomes available at [facility] or [facility].  The risks of surgery were discussed in detail and she understands these to including but not limited to bleeding requiring a blood transfusion,  infection, injury to adjacent organs (including but not limited to the bowels, bladder, ureters, nerves, blood vessels), thromboembolic events, wound separation, hernia, vaginal cuff separation, possible risk of lymphedema and lymphocyst if lymphadenectomy performed, unforseen complication, and possible need for re-exploration.  If the patient experiences any of these events, she understands that her hospitalization or recovery may be prolonged and that she may need to take additional medications for a prolonged period. The patient will receive DVT and antibiotic prophylaxis as indicated. She voiced a clear understanding. She had the opportunity to ask questions and informed consent was obtained today. She wishes to proceed.  She will proceed to the lab today for tumor marker. Given perimenopausal age, will obtain germ cell markers as well. And given pt report of increase facial hair, will obtain sex cord stromal markers as well. Collect: ca125, cea, ca19.9, LDH, AFP, hcg, inhibin, testosterone.  She does not require preoperative clearance. Her METs are >4.  All preoperative instructions were reviewed. Postoperative expectations were also reviewed. Written handouts were provided to the patient.  Additionally, given last pap was cytology only in 2019, pap smear collected today. Will follow-up results.    A copy of this note was sent to the patient's referring provider.  Clide Cliff, MD Gynecologic Oncology   Medical Decision Making I personally spent  TOTAL 60 minutes face-to-face and non-face-to-face in the care of this patient, which includes all pre, intra, and post visit time on the date of service.   ------------  CC: Abdominal pelvic mass  HISTORY OF PRESENT ILLNESS:  Haley Roach is a 53 y.o. woman who is seen in consultation at the request of Jarrett Soho, PA-C for evaluation of abdominopelvic mass.  Patient presented to her provider on 01/04/2024 for a  6-week  history of abdominal bloating and decreased appetite.  She had also noticed gradual distention and firmness of her abdomen.  She underwent a CT abdomen/pelvis on 01/06/2024 which noted a cystic and solid mass arising from the pelvis measuring 27 x 14 x 20.8 cm there was also note of small ascites and mild haziness and stranding in the omentum, no lymphadenopathy.  Today patient presents with her husband.  She reports approximately 54-month history of bloating, early satiety, firmness of the abdomen.  She also notes about an 8 pound weight loss.  She has experienced constipation and started senna about 6 weeks ago which has helped.  She denies changes in bladder habits.  She has not had any bleeding since her endometrial ablation.   PAST MEDICAL HISTORY: Past Medical History:  Diagnosis Date   Asthma    exercise induced asthma as a teenager   Headache    migraines   Hypertension    Kidney stones    PONV (postoperative nausea and vomiting)    after tonsillectomy    PAST SURGICAL HISTORY: Past Surgical History:  Procedure Laterality Date   EXCISION OF KELOID Right 03/16/2018   Procedure: EXCISION OF LOWER RIGHT BACK MASS;  Surgeon: Almond Lint, MD;  Location: [facility] OR;  Service: General;  Laterality: Right;   EXTRACORPOREAL SHOCK WAVE LITHOTRIPSY Right 01/31/2020   Procedure: EXTRACORPOREAL SHOCK WAVE LITHOTRIPSY (ESWL);  Surgeon: Alfredo Martinez, MD;  Location: [facility];  Service: Urology;  Laterality: Right;   TONSILLECTOMY     uterine ablation     WISDOM TOOTH EXTRACTION      OB/GYN HISTORY: OB History  Gravida Para Term Preterm AB Living  2 2 2   2   SAB IAB Ectopic Multiple Live Births      2    # Outcome Date GA Lbr Len/2nd Weight Sex Type Anes PTL Lv  2 Term      Vag-Spont   LIV  1 Term      Vag-Spont   LIV      Age at menarche: 25 Age at menopause: ablation at age 80, hotflashes started at age 66 Hx of HRT: OCPs in path Hx of STI: no Last pap:  08/13/18 NILM History of abnormal pap smears: none  SCREENING STUDIES:  Last mammogram: 05/2023 Last colonoscopy: cologuard 2023, no colonoscopy  MEDICATIONS:  Current Outpatient Medications:    amitriptyline (ELAVIL) 25 MG tablet, Take 25 mg by mouth at bedtime., Disp: , Rfl:    atorvastatin (LIPITOR) 20 MG tablet, Take 20 mg by mouth at bedtime., Disp: , Rfl:    furosemide (LASIX) 20 MG tablet, Take 20 mg by mouth daily., Disp: , Rfl:    hydrOXYzine (ATARAX) 25 MG tablet, Take 25 mg by mouth daily as needed., Disp: , Rfl:    losartan (COZAAR) 50 MG tablet, Take 50 mg by mouth daily., Disp: , Rfl:    metoprolol succinate (TOPROL-XL) 50 MG 24 hr tablet, TK 1 T PO ONCE D, Disp: , Rfl:    Probiotic Product (PROBIOTIC DAILY PO), Take by mouth., Disp: , Rfl:    senna (SENOKOT) 8.6 MG tablet, Take 1 tablet by mouth daily., Disp: , Rfl:   ALLERGIES: Allergies  Allergen Reactions   Codeine Nausea And Vomiting    FAMILY HISTORY: Family History  Problem Relation Age of Onset   Hypertension Mother    Lung cancer Maternal Grandmother    Breast cancer Neg Hx    Ovarian cancer Neg Hx  Endometrial cancer Neg Hx    Prostate cancer Neg Hx    Colon cancer Neg Hx    Pancreatic cancer Neg Hx     SOCIAL HISTORY: Social History   Socioeconomic History   Marital status: Married    Spouse name: Not on file   Number of children: Not on file   Years of education: Not on file   Highest education level: Not on file  Occupational History   Not on file  Tobacco Use   Smoking status: Never   Smokeless tobacco: Never  Vaping Use   Vaping status: Never Used  Substance and Sexual Activity   Alcohol use: Yes    Comment: rarely   Drug use: Never   Sexual activity: Yes  Other Topics Concern   Not on file  Social History Narrative   Not on file   Social Drivers of Health   Financial Resource Strain: Not on file  Food Insecurity: No Food Insecurity (01/20/2024)   Hunger Vital Sign     Worried About Running Out of Food in the Last Year: Never true    Ran Out of Food in the Last Year: Never true  Transportation Needs: No Transportation Needs (01/20/2024)   PRAPARE - Administrator, Civil Service (Medical): No    Lack of Transportation (Non-Medical): No  Physical Activity: Not on file  Stress: Not on file  Social Connections: Not on file  Intimate Partner Violence: Not At Risk (01/20/2024)   Humiliation, Afraid, Rape, and Kick questionnaire    Fear of Current or Ex-Partner: No    Emotionally Abused: No    Physically Abused: No    Sexually Abused: No    REVIEW OF SYSTEMS: New patient intake form was reviewed.  Complete 10-system review is negative except for the following: constipation, abdominal distention, hot flashes  PHYSICAL EXAM: BP (!) 169/88 (BP Location: Left Arm, Patient Position: Sitting) Comment: Notified RN  Pulse 100   Temp 98.3 F (36.8 C) (Oral)   Resp 19   Ht 5\' 4"  (1.626 m)   Wt 186 lb 12.8 oz (84.7 kg)   SpO2 100%   BMI 32.06 kg/m  Constitutional: No acute distress. Neuro/Psych: Alert, oriented.  Head and Neck: Normocephalic, atraumatic. Neck symmetric without masses.   Respiratory:Normal work of breathing. Clear to auscultation bilaterally. Cardiovascular: Regular rate and rhythm, no murmurs, rubs, or gallops. Abdomen: Normoactive bowel sounds. Soft, nontender.  Mass filling abdomen to above the level of the umbilicus. Extremities: Grossly normal range of motion. Warm, well perfused. No edema bilaterally. Skin: No rashes or lesions. Mild hirsutism with dark hair on lower abdomen to umbilicus. Lymphatic: No cervical, supraclavicular, or inguinal adenopathy. Genitourinary: External genitalia without lesions. Urethral meatus without lesions or prolapse. On speculum exam, vagina and cervix without lesions.  Pap smear collected.  Bimanual exam reveals normal cervix.  Unable to discretely palpate uterus.  Large abdominal pelvic mass  pulled up out of the pelvis and extending to above the level of the umbilicus.  Somewhat mobile. Exam chaperoned by Warner Mccreedy, NP   LABORATORY AND RADIOLOGIC DATA: Outside medical records were reviewed to synthesize the above history, along with the history and physical obtained during the visit.  Outside laboratory, pathology, and imaging reports were reviewed, with pertinent results below.  I personally reviewed the outside images.  WBC  Date Value Ref Range Status  01/21/2020 11.2 (H) 4.0 - 10.5 K/uL Final   Hemoglobin  Date Value Ref Range Status  01/21/2020 15.2 (H) 12.0 - 15.0 g/dL Final   HCT  Date Value Ref Range Status  01/21/2020 44.7 36.0 - 46.0 % Final   Platelets  Date Value Ref Range Status  01/21/2020 344 150 - 400 K/uL Final   Creatinine, Ser  Date Value Ref Range Status  01/21/2020 0.90 0.44 - 1.00 mg/dL Final   Diagnosis  Date Value Ref Range Status  08/13/2018   Final   NEGATIVE FOR INTRAEPITHELIAL LESIONS OR MALIGNANCY.    CT ABDOMEN PELVIS WO CONTRAST 01/06/2024  Narrative CLINICAL DATA:  Bloating with distension and elevated white count  EXAM: CT ABDOMEN AND PELVIS WITHOUT CONTRAST  TECHNIQUE: Multidetector CT imaging of the abdomen and pelvis was performed following the standard protocol without IV contrast.  RADIATION DOSE REDUCTION: This exam was performed according to the departmental dose-optimization program which includes automated exposure control, adjustment of the mA and/or kV according to patient size and/or use of iterative reconstruction technique.  COMPARISON:  CT 01/21/2020  FINDINGS: Lower chest: Lung bases demonstrate no acute airspace disease.  Hepatobiliary: No focal liver abnormality is seen. No gallstones, gallbladder wall thickening, or biliary dilatation.  Pancreas: Unremarkable. No pancreatic ductal dilatation or surrounding inflammatory changes.  Spleen: Normal in size without focal  abnormality.  Adrenals/Urinary Tract: Adrenal glands are normal. Minimal right hydronephrosis with otherwise normal caliber ureter and no obstructing stone. The bladder is unremarkable  Stomach/Bowel: Stomach is within normal limits. Appendix appears normal. No evidence of bowel wall thickening, distention, or inflammatory changes.  Vascular/Lymphatic: Aortic atherosclerosis. No enlarged abdominal or pelvic lymph nodes.  Reproductive: Uterus unremarkable. Large cystic and solid mass arising in the pelvis and extending to the supraumbilical abdomen, mass measures roughly 27 x 14 by 20.8 cm and is suspicious for ovarian neoplasm.  Other: No free air. Small volume ascites in the pelvis. Mild haziness and stranding in the omentum  Musculoskeletal: No acute or suspicious osseous abnormality.  IMPRESSION: 1. Large cystic and solid mass arising in the pelvis and extending to the supraumbilical abdomen, measuring roughly 27 x 14 x 20.8 cm and is suspicious for ovarian neoplasm. Small volume ascites in the pelvis. Mild haziness and stranding in the omentum raising concern for early omental disease. 2. Minimal right hydronephrosis with otherwise normal caliber ureter and no obstructing stone. 3. Aortic atherosclerosis.  Aortic Atherosclerosis (ICD10-I70.0).  These results will be called to the ordering clinician or representative by the Radiologist Assistant, and communication documented in the PACS or [employer].   Electronically Signed By: Jasmine Pang M.D. On: 01/06/2024 15:42

## 2024-01-23 NOTE — Progress Notes (Addendum)
 GYNECOLOGIC ONCOLOGY NEW PATIENT CONSULTATION  Date of Service: 01/23/2024 Referring Provider: Jarrett Soho, PA-C [address] [place],  [place] [address]   ASSESSMENT AND PLAN: Haley Roach is a 53 y.o. woman with 27cm cystic and solid abdominopelvic mass.  We reviewed that the exact etiology of the pelvic mass is unclear, but could include a benign, borderline, or malignant process.  The recommended treatment is surgical excision to make a definitive diagnosis.  Given the size of the mass, we will start with a diagnostic laparoscopy.  If no overt concerns for cancer, will consider mini laparotomy for controlled cyst drainage.  Reviewed risk of cyst spillage with minimally invasive approach versus laparotomy, but if no overt concerns for malignancy, reviewed benefits of minimally invasive approach versus large laparotomy.  Patient in agreement with this plan.  Given that she is at the average age of menopause and has had hot flashes for 2 years, reviewed the risks and benefits of contralateral salpingo-oophorectomy at time of surgery.  We will proceed with BSO.  In the event of malignancy or borderline tumor on frozen section, we will perform indicated staging procedures. We discussed that these procedures may include hysterectomy, omentectomy pelvic and/or para-aortic lymphadenectomy, peritoneal biopsies. We would also remove any tissue concerning for metastatic disease which could require additional procedures including bowel surgery.  Patient was consented for: Diagnostic laparoscopy, mini laparotomy for controlled cyst drainage, robotic assisted bilateral salpingo-oophorectomy, possible hysterectomy and staging, possible laparotomy on 03/06/24.  Will let patient know if earlier surgical date becomes available at [facility] or [facility].  The risks of surgery were discussed in detail and she understands these to including but not limited to bleeding requiring a blood transfusion,  infection, injury to adjacent organs (including but not limited to the bowels, bladder, ureters, nerves, blood vessels), thromboembolic events, wound separation, hernia, vaginal cuff separation, possible risk of lymphedema and lymphocyst if lymphadenectomy performed, unforseen complication, and possible need for re-exploration.  If the patient experiences any of these events, she understands that her hospitalization or recovery may be prolonged and that she may need to take additional medications for a prolonged period. The patient will receive DVT and antibiotic prophylaxis as indicated. She voiced a clear understanding. She had the opportunity to ask questions and informed consent was obtained today. She wishes to proceed.  She will proceed to the lab today for tumor marker. Given perimenopausal age, will obtain germ cell markers as well. And given pt report of increase facial hair, will obtain sex cord stromal markers as well. Collect: ca125, cea, ca19.9, LDH, AFP, hcg, inhibin, testosterone.  She does not require preoperative clearance. Her METs are >4.  All preoperative instructions were reviewed. Postoperative expectations were also reviewed. Written handouts were provided to the patient.  Additionally, given last pap was cytology only in 2019, pap smear collected today. Will follow-up results.    A copy of this note was sent to the patient's referring provider.  Clide Cliff, MD Gynecologic Oncology   Medical Decision Making I personally spent  TOTAL 60 minutes face-to-face and non-face-to-face in the care of this patient, which includes all pre, intra, and post visit time on the date of service.   ------------  CC: Abdominal pelvic mass  HISTORY OF PRESENT ILLNESS:  Haley Roach is a 53 y.o. woman who is seen in consultation at the request of Jarrett Soho, PA-C for evaluation of abdominopelvic mass.  Patient presented to her provider on 01/04/2024 for a  6-week  history of abdominal bloating and decreased appetite.  She had also noticed gradual distention and firmness of her abdomen.  She underwent a CT abdomen/pelvis on 01/06/2024 which noted a cystic and solid mass arising from the pelvis measuring 27 x 14 x 20.8 cm there was also note of small ascites and mild haziness and stranding in the omentum, no lymphadenopathy.  Today patient presents with her husband.  She reports approximately 54-month history of bloating, early satiety, firmness of the abdomen.  She also notes about an 8 pound weight loss.  She has experienced constipation and started senna about 6 weeks ago which has helped.  She denies changes in bladder habits.  She has not had any bleeding since her endometrial ablation.   PAST MEDICAL HISTORY: Past Medical History:  Diagnosis Date   Asthma    exercise induced asthma as a teenager   Headache    migraines   Hypertension    Kidney stones    PONV (postoperative nausea and vomiting)    after tonsillectomy    PAST SURGICAL HISTORY: Past Surgical History:  Procedure Laterality Date   EXCISION OF KELOID Right 03/16/2018   Procedure: EXCISION OF LOWER RIGHT BACK MASS;  Surgeon: Almond Lint, MD;  Location: [facility] OR;  Service: General;  Laterality: Right;   EXTRACORPOREAL SHOCK WAVE LITHOTRIPSY Right 01/31/2020   Procedure: EXTRACORPOREAL SHOCK WAVE LITHOTRIPSY (ESWL);  Surgeon: Alfredo Martinez, MD;  Location: [facility];  Service: Urology;  Laterality: Right;   TONSILLECTOMY     uterine ablation     WISDOM TOOTH EXTRACTION      OB/GYN HISTORY: OB History  Gravida Para Term Preterm AB Living  2 2 2   2   SAB IAB Ectopic Multiple Live Births      2    # Outcome Date GA Lbr Len/2nd Weight Sex Type Anes PTL Lv  2 Term      Vag-Spont   LIV  1 Term      Vag-Spont   LIV      Age at menarche: 25 Age at menopause: ablation at age 80, hotflashes started at age 66 Hx of HRT: OCPs in path Hx of STI: no Last pap:  08/13/18 NILM History of abnormal pap smears: none  SCREENING STUDIES:  Last mammogram: 05/2023 Last colonoscopy: cologuard 2023, no colonoscopy  MEDICATIONS:  Current Outpatient Medications:    amitriptyline (ELAVIL) 25 MG tablet, Take 25 mg by mouth at bedtime., Disp: , Rfl:    atorvastatin (LIPITOR) 20 MG tablet, Take 20 mg by mouth at bedtime., Disp: , Rfl:    furosemide (LASIX) 20 MG tablet, Take 20 mg by mouth daily., Disp: , Rfl:    hydrOXYzine (ATARAX) 25 MG tablet, Take 25 mg by mouth daily as needed., Disp: , Rfl:    losartan (COZAAR) 50 MG tablet, Take 50 mg by mouth daily., Disp: , Rfl:    metoprolol succinate (TOPROL-XL) 50 MG 24 hr tablet, TK 1 T PO ONCE D, Disp: , Rfl:    Probiotic Product (PROBIOTIC DAILY PO), Take by mouth., Disp: , Rfl:    senna (SENOKOT) 8.6 MG tablet, Take 1 tablet by mouth daily., Disp: , Rfl:   ALLERGIES: Allergies  Allergen Reactions   Codeine Nausea And Vomiting    FAMILY HISTORY: Family History  Problem Relation Age of Onset   Hypertension Mother    Lung cancer Maternal Grandmother    Breast cancer Neg Hx    Ovarian cancer Neg Hx  Endometrial cancer Neg Hx    Prostate cancer Neg Hx    Colon cancer Neg Hx    Pancreatic cancer Neg Hx     SOCIAL HISTORY: Social History   Socioeconomic History   Marital status: Married    Spouse name: Not on file   Number of children: Not on file   Years of education: Not on file   Highest education level: Not on file  Occupational History   Not on file  Tobacco Use   Smoking status: Never   Smokeless tobacco: Never  Vaping Use   Vaping status: Never Used  Substance and Sexual Activity   Alcohol use: Yes    Comment: rarely   Drug use: Never   Sexual activity: Yes  Other Topics Concern   Not on file  Social History Narrative   Not on file   Social Drivers of Health   Financial Resource Strain: Not on file  Food Insecurity: No Food Insecurity (01/20/2024)   Hunger Vital Sign     Worried About Running Out of Food in the Last Year: Never true    Ran Out of Food in the Last Year: Never true  Transportation Needs: No Transportation Needs (01/20/2024)   PRAPARE - Administrator, Civil Service (Medical): No    Lack of Transportation (Non-Medical): No  Physical Activity: Not on file  Stress: Not on file  Social Connections: Not on file  Intimate Partner Violence: Not At Risk (01/20/2024)   Humiliation, Afraid, Rape, and Kick questionnaire    Fear of Current or Ex-Partner: No    Emotionally Abused: No    Physically Abused: No    Sexually Abused: No    REVIEW OF SYSTEMS: New patient intake form was reviewed.  Complete 10-system review is negative except for the following: constipation, abdominal distention, hot flashes  PHYSICAL EXAM: BP (!) 169/88 (BP Location: Left Arm, Patient Position: Sitting) Comment: Notified RN  Pulse 100   Temp 98.3 F (36.8 C) (Oral)   Resp 19   Ht 5\' 4"  (1.626 m)   Wt 186 lb 12.8 oz (84.7 kg)   SpO2 100%   BMI 32.06 kg/m  Constitutional: No acute distress. Neuro/Psych: Alert, oriented.  Head and Neck: Normocephalic, atraumatic. Neck symmetric without masses.   Respiratory:Normal work of breathing. Clear to auscultation bilaterally. Cardiovascular: Regular rate and rhythm, no murmurs, rubs, or gallops. Abdomen: Normoactive bowel sounds. Soft, nontender.  Mass filling abdomen to above the level of the umbilicus. Extremities: Grossly normal range of motion. Warm, well perfused. No edema bilaterally. Skin: No rashes or lesions. Mild hirsutism with dark hair on lower abdomen to umbilicus. Lymphatic: No cervical, supraclavicular, or inguinal adenopathy. Genitourinary: External genitalia without lesions. Urethral meatus without lesions or prolapse. On speculum exam, vagina and cervix without lesions.  Pap smear collected.  Bimanual exam reveals normal cervix.  Unable to discretely palpate uterus.  Large abdominal pelvic mass  pulled up out of the pelvis and extending to above the level of the umbilicus.  Somewhat mobile. Exam chaperoned by Warner Mccreedy, NP   LABORATORY AND RADIOLOGIC DATA: Outside medical records were reviewed to synthesize the above history, along with the history and physical obtained during the visit.  Outside laboratory, pathology, and imaging reports were reviewed, with pertinent results below.  I personally reviewed the outside images.  WBC  Date Value Ref Range Status  01/21/2020 11.2 (H) 4.0 - 10.5 K/uL Final   Hemoglobin  Date Value Ref Range Status  01/21/2020 15.2 (H) 12.0 - 15.0 g/dL Final   HCT  Date Value Ref Range Status  01/21/2020 44.7 36.0 - 46.0 % Final   Platelets  Date Value Ref Range Status  01/21/2020 344 150 - 400 K/uL Final   Creatinine, Ser  Date Value Ref Range Status  01/21/2020 0.90 0.44 - 1.00 mg/dL Final   Diagnosis  Date Value Ref Range Status  08/13/2018   Final   NEGATIVE FOR INTRAEPITHELIAL LESIONS OR MALIGNANCY.    CT ABDOMEN PELVIS WO CONTRAST 01/06/2024  Narrative CLINICAL DATA:  Bloating with distension and elevated white count  EXAM: CT ABDOMEN AND PELVIS WITHOUT CONTRAST  TECHNIQUE: Multidetector CT imaging of the abdomen and pelvis was performed following the standard protocol without IV contrast.  RADIATION DOSE REDUCTION: This exam was performed according to the departmental dose-optimization program which includes automated exposure control, adjustment of the mA and/or kV according to patient size and/or use of iterative reconstruction technique.  COMPARISON:  CT 01/21/2020  FINDINGS: Lower chest: Lung bases demonstrate no acute airspace disease.  Hepatobiliary: No focal liver abnormality is seen. No gallstones, gallbladder wall thickening, or biliary dilatation.  Pancreas: Unremarkable. No pancreatic ductal dilatation or surrounding inflammatory changes.  Spleen: Normal in size without focal  abnormality.  Adrenals/Urinary Tract: Adrenal glands are normal. Minimal right hydronephrosis with otherwise normal caliber ureter and no obstructing stone. The bladder is unremarkable  Stomach/Bowel: Stomach is within normal limits. Appendix appears normal. No evidence of bowel wall thickening, distention, or inflammatory changes.  Vascular/Lymphatic: Aortic atherosclerosis. No enlarged abdominal or pelvic lymph nodes.  Reproductive: Uterus unremarkable. Large cystic and solid mass arising in the pelvis and extending to the supraumbilical abdomen, mass measures roughly 27 x 14 by 20.8 cm and is suspicious for ovarian neoplasm.  Other: No free air. Small volume ascites in the pelvis. Mild haziness and stranding in the omentum  Musculoskeletal: No acute or suspicious osseous abnormality.  IMPRESSION: 1. Large cystic and solid mass arising in the pelvis and extending to the supraumbilical abdomen, measuring roughly 27 x 14 x 20.8 cm and is suspicious for ovarian neoplasm. Small volume ascites in the pelvis. Mild haziness and stranding in the omentum raising concern for early omental disease. 2. Minimal right hydronephrosis with otherwise normal caliber ureter and no obstructing stone. 3. Aortic atherosclerosis.  Aortic Atherosclerosis (ICD10-I70.0).  These results will be called to the ordering clinician or representative by the Radiologist Assistant, and communication documented in the PACS or [employer].   Electronically Signed By: Jasmine Pang M.D. On: 01/06/2024 15:42

## 2024-01-23 NOTE — Patient Instructions (Addendum)
 Plan on having lab work today and the results will be released to you when available.   Preparing for your Surgery  Plan for surgery on March 06, 2024 with Dr. Clide Cliff at [facility]. You will be scheduled for diagnostic laparoscopy (looking into the abdomen through a small incision), mini laparotomy (slightly larger incision) for controlled cyst drainage, robotic assisted laparoscopic bilateral salpingo-oophorectomy (removal of both ovaries and fallopian tubes), possible total hysterectomy (uterus and cervix), possible staging if a precancer or cancer is identified, possible laparotomy (larger incision).   Pre-operative Testing -You will receive a phone call from presurgical testing at [facility] to arrange for a pre-operative appointment and lab work.  -Bring your insurance card, copy of an advanced directive if applicable, medication list  -At that visit, you will be asked to sign a consent for a possible blood transfusion in case a transfusion becomes necessary during surgery.  The need for a blood transfusion is rare but having consent is a necessary part of your care.     -You should not be taking blood thinners or aspirin at least ten days prior to surgery unless instructed by your surgeon.  -Do not take supplements such as fish oil (omega 3), red yeast rice, turmeric before your surgery. STOP TAKING AT LEAST 10 DAYS BEFORE SURGERY. You want to avoid medications with aspirin in them including headache powders such as BC or Goody's), Excedrin migraine.  Day Before Surgery at Home -You will be asked to take in a light diet the day before surgery. You will be advised you can have clear liquids up until 3 hours before your surgery.    Eat a light diet the day before surgery.  Examples including soups, broths, toast, yogurt, mashed potatoes.  AVOID GAS PRODUCING FOODS AND BEVERAGES. Things to avoid include carbonated beverages (fizzy beverages, sodas), raw fruits and  raw vegetables (uncooked), or beans.   If your bowels are filled with gas, your surgeon will have difficulty visualizing your pelvic organs which increases your surgical risks.  Your role in recovery Your role is to become active as soon as directed by your doctor, while still giving yourself time to heal.  Rest when you feel tired. You will be asked to do the following in order to speed your recovery:  - Cough and breathe deeply. This helps to clear and expand your lungs and can prevent pneumonia after surgery.  - STAY ACTIVE WHEN YOU GET HOME. Do mild physical activity. Walking or moving your legs help your circulation and body functions return to normal. Do not try to get up or walk alone the first time after surgery.   -If you develop swelling on one leg or the other, pain in the back of your leg, redness/warmth in one of your legs, please call the office or go to the Emergency Room to have a doppler to rule out a blood clot. For shortness of breath, chest pain-seek care in the Emergency Room as soon as possible. - Actively manage your pain. Managing your pain lets you move in comfort. We will ask you to rate your pain on a scale of zero to 10. It is your responsibility to tell your doctor or nurse where and how much you hurt so your pain can be treated.  Special Considerations -If you are diabetic, you may be placed on insulin after surgery to have closer control over your blood sugars to promote healing and recovery.  This does not mean that  you will be discharged on insulin.  If applicable, your oral antidiabetics will be resumed when you are tolerating a solid diet.  -Your final pathology results from surgery should be available around one week after surgery and the results will be relayed to you when available.  -Dr. Antionette Char is the surgeon that assists your GYN Oncologist with surgery.  If you end up staying the night, the next day after your surgery you will either see Dr.  Pricilla Holm, Dr. Alvester Morin, or Dr. Antionette Char.  -FMLA forms can be faxed to [phone] and please allow 5-7 business days for completion.  Pain Management After Surgery -You will be prescribed your pain medication and bowel regimen medications before surgery so that you can have these available when you are discharged from the hospital. The pain medication is for use ONLY AFTER surgery and a new prescription will not be given.   -Make sure that you have Tylenol and Ibuprofen IF YOU ARE ABLE TO TAKE THESE MEDICATIONS at home to use on a regular basis after surgery for pain control. We recommend alternating the medications every hour to six hours since they work differently and are processed in the body differently for pain relief.  -Review the attached handout on narcotic use and their risks and side effects.   Bowel Regimen -You will be prescribed Sennakot-S to take nightly to prevent constipation especially if you are taking the narcotic pain medication intermittently.  It is important to prevent constipation and drink adequate amounts of liquids. You can stop taking this medication when you are not taking pain medication and you are back on your normal bowel routine.  Risks of Surgery Risks of surgery are low but include bleeding, infection, damage to surrounding structures, re-operation, blood clots, and very rarely death.   Blood Transfusion Information (For the consent to be signed before surgery)  We will be checking your blood type before surgery so in case of emergencies, we will know what type of blood you would need.                                            WHAT IS A BLOOD TRANSFUSION?  A transfusion is the replacement of blood or some of its parts. Blood is made up of multiple cells which provide different functions. Red blood cells carry oxygen and are used for blood loss replacement. White blood cells fight against infection. Platelets control bleeding. Plasma helps clot  blood. Other blood products are available for specialized needs, such as hemophilia or other clotting disorders. BEFORE THE TRANSFUSION  Who gives blood for transfusions?  You may be able to donate blood to be used at a later date on yourself (autologous donation). Relatives can be asked to donate blood. This is generally not any safer than if you have received blood from a stranger. The same precautions are taken to ensure safety when a relative's blood is donated. Healthy volunteers who are fully evaluated to make sure their blood is safe. This is blood bank blood. Transfusion therapy is the safest it has ever been in the practice of medicine. Before blood is taken from a donor, a complete history is taken to make sure that person has no history of diseases nor engages in risky social behavior (examples are intravenous drug use or sexual activity with multiple partners). The donor's travel history is screened to minimize  risk of transmitting infections, such as malaria. The donated blood is tested for signs of infectious diseases, such as HIV and hepatitis. The blood is then tested to be sure it is compatible with you in order to minimize the chance of a transfusion reaction. If you or a relative donates blood, this is often done in anticipation of surgery and is not appropriate for emergency situations. It takes many days to process the donated blood. RISKS AND COMPLICATIONS Although transfusion therapy is very safe and saves many lives, the main dangers of transfusion include:  Getting an infectious disease. Developing a transfusion reaction. This is an allergic reaction to something in the blood you were given. Every precaution is taken to prevent this. The decision to have a blood transfusion has been considered carefully by your caregiver before blood is given. Blood is not given unless the benefits outweigh the risks.  AFTER SURGERY INSTRUCTIONS  Return to work: 4-6 weeks if applicable  You  may have a white honeycomb dressing over your larger incision. This dressing can be removed 5 days after surgery and you do not need to reapply a new dressing. Once you remove the dressing, you will notice that you have the surgical glue (dermabond) on the incision and this will peel off on its own. You can get this dressing wet in the shower the days after surgery prior to removal on the 5th day.   Activity: 1. Be up and out of the bed during the day.  Take a nap if needed.  You may walk up steps but be careful and use the hand rail.  Stair climbing will tire you more than you think, you may need to stop part way and rest.   2. No lifting or straining for 6 weeks over 10 pounds. No pushing, pulling, straining for 6 weeks.  3. No driving for 1-61 days when the following criteria have been met: Do not drive if you are taking narcotic pain medicine and make sure that your reaction time has returned.   4. You can shower as soon as the next day after surgery. Shower daily.  Use your regular soap and water (not directly on the incision) and pat your incision(s) dry afterwards; don't rub.  No tub baths or submerging your body in water until cleared by your surgeon. If you have the soap that was given to you by pre-surgical testing that was used before surgery, you do not need to use it afterwards because this can irritate your incisions.   5. No sexual activity and nothing in the vagina for 6 weeks, 10-12 weeks if you have a hysterectomy (removal of the uterus and cervix).  6. You may experience a small amount of clear drainage from your incisions, which is normal.  If the drainage persists, increases, or changes color please call the office.  7. Do not use creams, lotions, or ointments such as neosporin on your incisions after surgery until advised by your surgeon because they can cause removal of the dermabond glue on your incisions.    8. You may experience vaginal spotting after surgery or when the  stitches at the top of the vagina begin to dissolve (if you have a hysterectomy).  The spotting is normal but if you experience heavy bleeding, call our office.  9. Take Tylenol or ibuprofen first for pain if you are able to take these medications and only use narcotic pain medication for severe pain not relieved by the Tylenol or Ibuprofen.  Monitor your Tylenol intake to a max of 4,000 mg in a 24 hour period. You can alternate these medications after surgery.  Diet: 1. Low sodium Heart Healthy Diet is recommended but you are cleared to resume your normal (before surgery) diet after your procedure.  2. It is safe to use a laxative, such as Miralax or Colace, if you have difficulty moving your bowels before surgery. You have been prescribed Sennakot-S to take at bedtime every evening after surgery to keep bowel movements regular and to prevent constipation.    Wound Care: 1. Keep clean and dry.  Shower daily.  Reasons to call the Doctor: Fever - Oral temperature greater than 100.4 degrees Fahrenheit Foul-smelling vaginal discharge Difficulty urinating Nausea and vomiting Increased pain at the site of the incision that is unrelieved with pain medicine. Difficulty breathing with or without chest pain New calf pain especially if only on one side Sudden, continuing increased vaginal bleeding with or without clots.   Contacts: For questions or concerns you should contact:  Dr. Clide Cliff at [phone]  Warner Mccreedy, NP at [phone]  After Hours: call [phone] and have the GYN Oncologist paged/contacted (after 5 pm or on the weekends). You will speak with an after hours RN and let he or she know you have had surgery.  Messages sent via mychart are for non-urgent matters and are not responded to after hours so for urgent needs, please call the after hours number.

## 2024-01-24 LAB — CANCER ANTIGEN 19-9: CA 19-9: 15 U/mL (ref 0–35)

## 2024-01-24 LAB — AFP TUMOR MARKER: AFP, Serum, Tumor Marker: 9.3 ng/mL — ABNORMAL HIGH (ref 0.0–9.2)

## 2024-01-24 LAB — CA 125: Cancer Antigen (CA) 125: 313 U/mL — ABNORMAL HIGH (ref 0.0–38.1)

## 2024-01-24 LAB — BETA HCG QUANT (REF LAB): hCG Quant: <1 m[IU]/mL

## 2024-01-25 ENCOUNTER — Encounter: Payer: Self-pay | Admitting: Psychiatry

## 2024-01-25 ENCOUNTER — Other Ambulatory Visit: Payer: Self-pay | Admitting: Gynecologic Oncology

## 2024-01-25 DIAGNOSIS — R1900 Intra-abdominal and pelvic swelling, mass and lump, unspecified site: Secondary | ICD-10-CM

## 2024-01-25 LAB — INHIBIN B: Inhibin B: 17.7 pg/mL

## 2024-01-25 MED ORDER — SENNOSIDES-DOCUSATE SODIUM 8.6-50 MG PO TABS: 2.0000 | ORAL_TABLET | Freq: Every day | ORAL | 0 refills | Status: AC

## 2024-01-25 MED ORDER — TRAMADOL HCL 50 MG PO TABS
50.0000 mg | ORAL_TABLET | Freq: Four times a day (QID) | ORAL | 0 refills | Status: AC | PRN
Start: 2024-01-25 — End: ?

## 2024-01-25 NOTE — Patient Instructions (Signed)
 Plan on having lab work today and the results will be released to you when available.   Preparing for your Surgery  Plan for surgery on March 06, 2024 with Dr. Clide Cliff at [facility]. You will be scheduled for diagnostic laparoscopy (looking into the abdomen through a small incision), mini laparotomy (slightly larger incision) for controlled cyst drainage, robotic assisted laparoscopic bilateral salpingo-oophorectomy (removal of both ovaries and fallopian tubes), possible total hysterectomy (uterus and cervix), possible staging if a precancer or cancer is identified, possible laparotomy (larger incision).   Pre-operative Testing -You will receive a phone call from presurgical testing at [facility] to arrange for a pre-operative appointment and lab work.  -Bring your insurance card, copy of an advanced directive if applicable, medication list  -At that visit, you will be asked to sign a consent for a possible blood transfusion in case a transfusion becomes necessary during surgery.  The need for a blood transfusion is rare but having consent is a necessary part of your care.     -You should not be taking blood thinners or aspirin at least ten days prior to surgery unless instructed by your surgeon.  -Do not take supplements such as fish oil (omega 3), red yeast rice, turmeric before your surgery. STOP TAKING AT LEAST 10 DAYS BEFORE SURGERY. You want to avoid medications with aspirin in them including headache powders such as BC or Goody's), Excedrin migraine.  Day Before Surgery at Home -You will be asked to take in a light diet the day before surgery. You will be advised you can have clear liquids up until 3 hours before your surgery.    Eat a light diet the day before surgery.  Examples including soups, broths, toast, yogurt, mashed potatoes.  AVOID GAS PRODUCING FOODS AND BEVERAGES. Things to avoid include carbonated beverages (fizzy beverages, sodas), raw fruits and  raw vegetables (uncooked), or beans.   If your bowels are filled with gas, your surgeon will have difficulty visualizing your pelvic organs which increases your surgical risks.  Your role in recovery Your role is to become active as soon as directed by your doctor, while still giving yourself time to heal.  Rest when you feel tired. You will be asked to do the following in order to speed your recovery:  - Cough and breathe deeply. This helps to clear and expand your lungs and can prevent pneumonia after surgery.  - STAY ACTIVE WHEN YOU GET HOME. Do mild physical activity. Walking or moving your legs help your circulation and body functions return to normal. Do not try to get up or walk alone the first time after surgery.   -If you develop swelling on one leg or the other, pain in the back of your leg, redness/warmth in one of your legs, please call the office or go to the Emergency Room to have a doppler to rule out a blood clot. For shortness of breath, chest pain-seek care in the Emergency Room as soon as possible. - Actively manage your pain. Managing your pain lets you move in comfort. We will ask you to rate your pain on a scale of zero to 10. It is your responsibility to tell your doctor or nurse where and how much you hurt so your pain can be treated.  Special Considerations -If you are diabetic, you may be placed on insulin after surgery to have closer control over your blood sugars to promote healing and recovery.  This does not mean that  you will be discharged on insulin.  If applicable, your oral antidiabetics will be resumed when you are tolerating a solid diet.  -Your final pathology results from surgery should be available around one week after surgery and the results will be relayed to you when available.  -Dr. Antionette Char is the surgeon that assists your GYN Oncologist with surgery.  If you end up staying the night, the next day after your surgery you will either see Dr.  Pricilla Holm, Dr. Alvester Morin, or Dr. Antionette Char.  -FMLA forms can be faxed to [phone] and please allow 5-7 business days for completion.  Pain Management After Surgery -You will be prescribed your pain medication and bowel regimen medications before surgery so that you can have these available when you are discharged from the hospital. The pain medication is for use ONLY AFTER surgery and a new prescription will not be given.   -Make sure that you have Tylenol and Ibuprofen IF YOU ARE ABLE TO TAKE THESE MEDICATIONS at home to use on a regular basis after surgery for pain control. We recommend alternating the medications every hour to six hours since they work differently and are processed in the body differently for pain relief.  -Review the attached handout on narcotic use and their risks and side effects.   Bowel Regimen -You will be prescribed Sennakot-S to take nightly to prevent constipation especially if you are taking the narcotic pain medication intermittently.  It is important to prevent constipation and drink adequate amounts of liquids. You can stop taking this medication when you are not taking pain medication and you are back on your normal bowel routine.  Risks of Surgery Risks of surgery are low but include bleeding, infection, damage to surrounding structures, re-operation, blood clots, and very rarely death.   Blood Transfusion Information (For the consent to be signed before surgery)  We will be checking your blood type before surgery so in case of emergencies, we will know what type of blood you would need.                                            WHAT IS A BLOOD TRANSFUSION?  A transfusion is the replacement of blood or some of its parts. Blood is made up of multiple cells which provide different functions. Red blood cells carry oxygen and are used for blood loss replacement. White blood cells fight against infection. Platelets control bleeding. Plasma helps clot  blood. Other blood products are available for specialized needs, such as hemophilia or other clotting disorders. BEFORE THE TRANSFUSION  Who gives blood for transfusions?  You may be able to donate blood to be used at a later date on yourself (autologous donation). Relatives can be asked to donate blood. This is generally not any safer than if you have received blood from a stranger. The same precautions are taken to ensure safety when a relative's blood is donated. Healthy volunteers who are fully evaluated to make sure their blood is safe. This is blood bank blood. Transfusion therapy is the safest it has ever been in the practice of medicine. Before blood is taken from a donor, a complete history is taken to make sure that person has no history of diseases nor engages in risky social behavior (examples are intravenous drug use or sexual activity with multiple partners). The donor's travel history is screened to minimize  risk of transmitting infections, such as malaria. The donated blood is tested for signs of infectious diseases, such as HIV and hepatitis. The blood is then tested to be sure it is compatible with you in order to minimize the chance of a transfusion reaction. If you or a relative donates blood, this is often done in anticipation of surgery and is not appropriate for emergency situations. It takes many days to process the donated blood. RISKS AND COMPLICATIONS Although transfusion therapy is very safe and saves many lives, the main dangers of transfusion include:  Getting an infectious disease. Developing a transfusion reaction. This is an allergic reaction to something in the blood you were given. Every precaution is taken to prevent this. The decision to have a blood transfusion has been considered carefully by your caregiver before blood is given. Blood is not given unless the benefits outweigh the risks.  AFTER SURGERY INSTRUCTIONS  Return to work: 4-6 weeks if applicable  You  may have a white honeycomb dressing over your larger incision. This dressing can be removed 5 days after surgery and you do not need to reapply a new dressing. Once you remove the dressing, you will notice that you have the surgical glue (dermabond) on the incision and this will peel off on its own. You can get this dressing wet in the shower the days after surgery prior to removal on the 5th day.   Activity: 1. Be up and out of the bed during the day.  Take a nap if needed.  You may walk up steps but be careful and use the hand rail.  Stair climbing will tire you more than you think, you may need to stop part way and rest.   2. No lifting or straining for 6 weeks over 10 pounds. No pushing, pulling, straining for 6 weeks.  3. No driving for 1-61 days when the following criteria have been met: Do not drive if you are taking narcotic pain medicine and make sure that your reaction time has returned.   4. You can shower as soon as the next day after surgery. Shower daily.  Use your regular soap and water (not directly on the incision) and pat your incision(s) dry afterwards; don't rub.  No tub baths or submerging your body in water until cleared by your surgeon. If you have the soap that was given to you by pre-surgical testing that was used before surgery, you do not need to use it afterwards because this can irritate your incisions.   5. No sexual activity and nothing in the vagina for 6 weeks, 10-12 weeks if you have a hysterectomy (removal of the uterus and cervix).  6. You may experience a small amount of clear drainage from your incisions, which is normal.  If the drainage persists, increases, or changes color please call the office.  7. Do not use creams, lotions, or ointments such as neosporin on your incisions after surgery until advised by your surgeon because they can cause removal of the dermabond glue on your incisions.    8. You may experience vaginal spotting after surgery or when the  stitches at the top of the vagina begin to dissolve (if you have a hysterectomy).  The spotting is normal but if you experience heavy bleeding, call our office.  9. Take Tylenol or ibuprofen first for pain if you are able to take these medications and only use narcotic pain medication for severe pain not relieved by the Tylenol or Ibuprofen.  Monitor your Tylenol intake to a max of 4,000 mg in a 24 hour period. You can alternate these medications after surgery.  Diet: 1. Low sodium Heart Healthy Diet is recommended but you are cleared to resume your normal (before surgery) diet after your procedure.  2. It is safe to use a laxative, such as Miralax or Colace, if you have difficulty moving your bowels before surgery. You have been prescribed Sennakot-S to take at bedtime every evening after surgery to keep bowel movements regular and to prevent constipation.    Wound Care: 1. Keep clean and dry.  Shower daily.  Reasons to call the Doctor: Fever - Oral temperature greater than 100.4 degrees Fahrenheit Foul-smelling vaginal discharge Difficulty urinating Nausea and vomiting Increased pain at the site of the incision that is unrelieved with pain medicine. Difficulty breathing with or without chest pain New calf pain especially if only on one side Sudden, continuing increased vaginal bleeding with or without clots.   Contacts: For questions or concerns you should contact:  Dr. Clide Cliff at [phone]  Warner Mccreedy, NP at [phone]  After Hours: call [phone] and have the GYN Oncologist paged/contacted (after 5 pm or on the weekends). You will speak with an after hours RN and let he or she know you have had surgery.  Messages sent via mychart are for non-urgent matters and are not responded to after hours so for urgent needs, please call the after hours number.

## 2024-01-25 NOTE — Progress Notes (Signed)
 Patient here for new patient consultation and for a pre-operative appointment prior to her scheduled surgery on 03/06/2024. She is scheduled for diagnostic laparoscopy (looking into the abdomen through a small incision), mini laparotomy (slightly larger incision) for controlled cyst drainage, robotic assisted laparoscopic bilateral salpingo-oophorectomy (removal of both ovaries and fallopian tubes), possible total hysterectomy (uterus and cervix), possible staging if a precancer or cancer is identified, possible laparotomy. The surgery was discussed in detail.  See after visit summary for additional details.    Discussed post-op pain management in detail including the aspects of the enhanced recovery pathway.  Advised her that a new prescription would be sent in for tramadol and it is only to be used for after her upcoming surgery.  We discussed the use of tylenol post-op and to monitor for a maximum of 4,000 mg in a 24 hour period.  Also prescribed sennakot to be used after surgery and to hold if having loose stools.  Discussed bowel regimen in detail.     Discussed measures to take at home to prevent DVT including frequent mobility.  Reportable signs and symptoms of DVT discussed. Post-operative instructions discussed and expectations for after surgery. Incisional care discussed as well including reportable signs and symptoms including erythema, drainage, wound separation.     10 minutes spent preparing information and with the patient.  Verbalizing understanding of material discussed. No needs or concerns voiced at the end of the visit.   Advised patient to call for any needs.  Advised that her post-operative medications had been prescribed and could be picked up at any time.    This appointment is included in the global surgical bundle as pre-operative teaching and has no charge.

## 2024-01-27 ENCOUNTER — Telehealth: Payer: Self-pay

## 2024-01-27 LAB — TESTOSTERONE, FREE, TOTAL, SHBG
Sex Hormone Binding: 29.8 nmol/L (ref 17.3–125.0)
Testosterone, Free: 6.1 pg/mL — ABNORMAL HIGH (ref 0.0–4.2)
Testosterone: 160 ng/dL — ABNORMAL HIGH (ref 4–50)

## 2024-01-27 NOTE — Telephone Encounter (Signed)
 FMLA forms received from [facility] regarding pt's upcoming surgery on 03/06/24 with Dr.Newton. Information filled out and faxed back.  Pt is aware

## 2024-01-30 ENCOUNTER — Encounter: Payer: Self-pay | Admitting: Psychiatry

## 2024-01-30 LAB — CYTOLOGY - PAP
Comment: NEGATIVE
Diagnosis: NEGATIVE
High risk HPV: NEGATIVE

## 2024-02-13 ENCOUNTER — Telehealth: Payer: Self-pay | Admitting: *Deleted

## 2024-02-13 NOTE — Telephone Encounter (Signed)
 Attempted to reach Ms. Haley Roach. Left voicemail requesting call back.

## 2024-02-13 NOTE — Telephone Encounter (Signed)
 Pt returned the office's call. Pt is aware that her surgery with Dr. Alvester Morin has been moved up to February 21, 2024. Pt was very pleased to here this. Advised her that [facility] pre-admit will be contacting pt to change her pre-admission appt. Pt verbalized understanding and thanked the office for calling.

## 2024-02-15 NOTE — Patient Instructions (Signed)
 DUE TO COVID-19 ONLY TWO VISITORS  (aged 52 and older)  ARE ALLOWED TO COME WITH YOU AND STAY IN THE WAITING ROOM ONLY DURING PRE OP AND PROCEDURE.   **NO VISITORS ARE ALLOWED IN THE SHORT STAY AREA OR RECOVERY ROOM!!**  IF YOU WILL BE ADMITTED INTO THE HOSPITAL YOU ARE ALLOWED ONLY FOUR SUPPORT PEOPLE DURING VISITATION HOURS ONLY (7 AM -8PM)   The support person(s) must pass our screening, gel in and out, and wear a mask at all times, including in the patient's room. Patients must also wear a mask when staff or their support person are in the room. Visitors GUEST BADGE MUST BE WORN VISIBLY  One adult visitor may remain with you overnight and MUST be in the room by 8 P.M.     Your procedure is scheduled on: 02/21/24   Report to [facility] Main Entrance    Report to admitting at : 10:30 AM   Call this number if you have problems the morning of surgery [phone]   Eat a light diet the day before surgery.  Examples including soups, broths, toast, yogurt, mashed potatoes.  Things to avoid include carbonated beverages (fizzy beverages), raw fruits and raw vegetables, or beans.   If your bowels are filled with gas, your surgeon will have difficulty visualizing your pelvic organs which increases your surgical risks.  Do not eat food :After Midnight.   After Midnight you may have the following liquids until : 9:45 AM DAY OF SURGERY  Water Black Coffee (sugar ok, NO MILK/CREAM OR CREAMERS)  Tea (sugar ok, NO MILK/CREAM OR CREAMERS) regular and decaf                             Plain Jell-O (NO RED)                                           Fruit ices (not with fruit pulp, NO RED)                                     Popsicles (NO RED)                                                                  Juice: apple, WHITE grape, WHITE cranberry Sports drinks like Gatorade (NO RED)              FOLLOW ANY ADDITIONAL PRE OP INSTRUCTIONS YOU RECEIVED FROM YOUR SURGEON'S OFFICE!!!  Oral  Hygiene is also important to reduce your risk of infection.                                    Remember - BRUSH YOUR TEETH THE MORNING OF SURGERY WITH YOUR REGULAR TOOTHPASTE  DENTURES WILL BE REMOVED PRIOR TO SURGERY PLEASE DO NOT APPLY "Poly grip" OR ADHESIVES!!!   Do NOT smoke after Midnight   Take these medicines the morning of surgery with A SIP OF WATER:  metoprolol.Hydroxyzine as needed.  STOP TAKING all Vitamins, Herbs and supplements 1 week before your surgery.                              You may not have any metal on your body including hair pins, jewelry, and body piercing             Do not wear make-up, lotions, powders, perfumes/cologne, or deodorant  Do not wear nail polish including gel and S&S, artificial/acrylic nails, or any other type of covering on natural nails including finger and toenails. If you have artificial nails, gel coating, etc. that needs to be removed by a nail salon please have this removed prior to surgery or surgery may need to be canceled/ delayed if the surgeon/ anesthesia feels like they are unable to be safely monitored.   Do not shave  48 hours prior to surgery.    Do not bring valuables to the hospital. [facility] IS NOT             RESPONSIBLE   FOR VALUABLES.   Contacts, glasses, or bridgework may not be worn into surgery.   Bring small overnight bag day of surgery.   DO NOT BRING YOUR HOME MEDICATIONS TO THE HOSPITAL. PHARMACY WILL DISPENSE MEDICATIONS LISTED ON YOUR MEDICATION LIST TO YOU DURING YOUR ADMISSION IN THE HOSPITAL!    Patients discharged on the day of surgery will not be allowed to drive home.  Someone NEEDS to stay with you for the first 24 hours after anesthesia.   Special Instructions: Bring a copy of your healthcare power of attorney and living will documents         the day of surgery if you haven't scanned them before.              Please read over the following fact sheets you were given: IF YOU HAVE QUESTIONS ABOUT  YOUR PRE-OP INSTRUCTIONS PLEASE CALL [phone]    [facility] - Preparing for Surgery Before surgery, you can play an important role.  Because skin is not sterile, your skin needs to be as free of germs as possible.  You can reduce the number of germs on your skin by washing with CHG (chlorahexidine gluconate) soap before surgery.  CHG is an antiseptic cleaner which kills germs and bonds with the skin to continue killing germs even after washing. Please DO NOT use if you have an allergy to CHG or antibacterial soaps.  If your skin becomes reddened/irritated stop using the CHG and inform your nurse when you arrive at Short Stay. Do not shave (including legs and underarms) for at least 48 hours prior to the first CHG shower.  You may shave your face/neck. Please follow these instructions carefully:  1.  Shower with CHG Soap the night before surgery and the  morning of Surgery.  2.  If you choose to wash your hair, wash your hair first as usual with your  normal  shampoo.  3.  After you shampoo, rinse your hair and body thoroughly to remove the  shampoo.                           4.  Use CHG as you would any other liquid soap.  You can apply chg directly  to the skin and wash  Gently with a scrungie or clean washcloth.  5.  Apply the CHG Soap to your body ONLY FROM THE NECK DOWN.   Do not use on face/ open                           Wound or open sores. Avoid contact with eyes, ears mouth and genitals (private parts).                       Wash face,  Genitals (private parts) with your normal soap.             6.  Wash thoroughly, paying special attention to the area where your surgery  will be performed.  7.  Thoroughly rinse your body with warm water from the neck down.  8.  DO NOT shower/wash with your normal soap after using and rinsing off  the CHG Soap.                9.  Pat yourself dry with a clean towel.            10.  Wear clean pajamas.            11.  Place clean  sheets on your bed the night of your first shower and do not  sleep with pets. Day of Surgery : Do not apply any lotions/deodorants the morning of surgery.  Please wear clean clothes to the hospital/surgery center.  FAILURE TO FOLLOW THESE INSTRUCTIONS MAY RESULT IN THE CANCELLATION OF YOUR SURGERY PATIENT SIGNATURE_________________________________  NURSE SIGNATURE__________________________________  ________________________________________________________________________ WHAT IS A BLOOD TRANSFUSION? Blood Transfusion Information  A transfusion is the replacement of blood or some of its parts. Blood is made up of multiple cells which provide different functions. Red blood cells carry oxygen and are used for blood loss replacement. White blood cells fight against infection. Platelets control bleeding. Plasma helps clot blood. Other blood products are available for specialized needs, such as hemophilia or other clotting disorders. BEFORE THE TRANSFUSION  Who gives blood for transfusions?  Healthy volunteers who are fully evaluated to make sure their blood is safe. This is blood bank blood. Transfusion therapy is the safest it has ever been in the practice of medicine. Before blood is taken from a donor, a complete history is taken to make sure that person has no history of diseases nor engages in risky social behavior (examples are intravenous drug use or sexual activity with multiple partners). The donor's travel history is screened to minimize risk of transmitting infections, such as malaria. The donated blood is tested for signs of infectious diseases, such as HIV and hepatitis. The blood is then tested to be sure it is compatible with you in order to minimize the chance of a transfusion reaction. If you or a relative donates blood, this is often done in anticipation of surgery and is not appropriate for emergency situations. It takes many days to process the donated blood. RISKS AND  COMPLICATIONS Although transfusion therapy is very safe and saves many lives, the main dangers of transfusion include:  Getting an infectious disease. Developing a transfusion reaction. This is an allergic reaction to something in the blood you were given. Every precaution is taken to prevent this. The decision to have a blood transfusion has been considered carefully by your caregiver before blood is given. Blood is not given unless the benefits outweigh the risks. AFTER THE TRANSFUSION Right after receiving  a blood transfusion, you will usually feel much better and more energetic. This is especially true if your red blood cells have gotten low (anemic). The transfusion raises the level of the red blood cells which carry oxygen, and this usually causes an energy increase. The nurse administering the transfusion will monitor you carefully for complications. HOME CARE INSTRUCTIONS  No special instructions are needed after a transfusion. You may find your energy is better. Speak with your caregiver about any limitations on activity for underlying diseases you may have. SEEK MEDICAL CARE IF:  Your condition is not improving after your transfusion. You develop redness or irritation at the intravenous (IV) site. SEEK IMMEDIATE MEDICAL CARE IF:  Any of the following symptoms occur over the next 12 hours: Shaking chills. You have a temperature by mouth above 102 F (38.9 C), not controlled by medicine. Chest, back, or muscle pain. People around you feel you are not acting correctly or are confused. Shortness of breath or difficulty breathing. Dizziness and fainting. You get a rash or develop hives. You have a decrease in urine output. Your urine turns a dark color or changes to pink, red, or brown. Any of the following symptoms occur over the next 10 days: You have a temperature by mouth above 102 F (38.9 C), not controlled by medicine. Shortness of breath. Weakness after normal activity. The  white part of the eye turns yellow (jaundice). You have a decrease in the amount of urine or are urinating less often. Your urine turns a dark color or changes to pink, red, or brown. Document Released: 11/05/2000 Document Revised: 01/31/2012 Document Reviewed: 06/24/2008 [facility] Patient Information 2014 [place], [place].  _______________________________________________________________________

## 2024-02-17 ENCOUNTER — Telehealth: Payer: Self-pay | Admitting: *Deleted

## 2024-02-17 ENCOUNTER — Encounter (HOSPITAL_COMMUNITY): Payer: Self-pay

## 2024-02-17 ENCOUNTER — Encounter (HOSPITAL_COMMUNITY)
Admission: RE | Admit: 2024-02-17 | Discharge: 2024-02-17 | Disposition: A | Discharge status: Home / Self Care | Source: Ambulatory Visit | Attending: Psychiatry | Admitting: Psychiatry

## 2024-02-17 ENCOUNTER — Other Ambulatory Visit: Payer: Self-pay

## 2024-02-17 VITALS — BP 142/86 | HR 91 | Temp 98.1°F | Ht 64.0 in | Wt 182.0 lb

## 2024-02-17 DIAGNOSIS — R1900 Intra-abdominal and pelvic swelling, mass and lump, unspecified site: Secondary | ICD-10-CM | POA: Insufficient documentation

## 2024-02-17 DIAGNOSIS — Z01818 Encounter for other preprocedural examination: Secondary | ICD-10-CM | POA: Insufficient documentation

## 2024-02-17 DIAGNOSIS — I10 Essential (primary) hypertension: Secondary | ICD-10-CM | POA: Insufficient documentation

## 2024-02-17 HISTORY — DX: Personal history of urinary calculi: Z87.442

## 2024-02-17 LAB — CBC
HCT: 38 % (ref 36.0–46.0)
Hemoglobin: 12.1 g/dL (ref 12.0–15.0)
MCH: 26.8 pg (ref 26.0–34.0)
MCHC: 31.8 g/dL (ref 30.0–36.0)
MCV: 84.1 fL (ref 80.0–100.0)
Platelets: 720 10*3/uL — ABNORMAL HIGH (ref 150–400)
RBC: 4.52 MIL/uL (ref 3.87–5.11)
RDW: 14.7 % (ref 11.5–15.5)
WBC: 11.4 10*3/uL — ABNORMAL HIGH (ref 4.0–10.5)
nRBC: 0 % (ref 0.0–0.2)

## 2024-02-17 LAB — COMPREHENSIVE METABOLIC PANEL WITH GFR
ALT: 20 U/L (ref 0–44)
AST: 26 U/L (ref 15–41)
Albumin: 3.3 g/dL — ABNORMAL LOW (ref 3.5–5.0)
Alkaline Phosphatase: 74 U/L (ref 38–126)
Anion gap: 9 (ref 5–15)
BUN: 10 mg/dL (ref 6–20)
CO2: 25 mmol/L (ref 22–32)
Calcium: 9.1 mg/dL (ref 8.9–10.3)
Chloride: 102 mmol/L (ref 98–111)
Creatinine, Ser: 0.75 mg/dL (ref 0.44–1.00)
GFR, Estimated: >60 mL/min (ref >60)
Glucose, Bld: 113 mg/dL — ABNORMAL HIGH (ref 70–99)
Potassium: 4.4 mmol/L (ref 3.5–5.1)
Sodium: 136 mmol/L (ref 135–145)
Total Bilirubin: 0.4 mg/dL (ref 0.0–1.2)
Total Protein: 7.6 g/dL (ref 6.5–8.1)

## 2024-02-17 NOTE — Progress Notes (Signed)
 For Anesthesia: PCP - Jarrett Soho, PA-C  Cardiologist - N/A  Bowel Prep reminder:  Chest x-ray -  EKG - 02/17/24 Stress Test -  ECHO -  Cardiac Cath -  Pacemaker/ICD device last checked: Pacemaker orders received: Device Rep notified:  Spinal Cord Stimulator:N/A  Sleep Study - N/A CPAP -   Fasting Blood Sugar - N/A Checks Blood Sugar _____ times a day Date and result of last Hgb A1c-  Last dose of GLP1 agonist- N/A GLP1 instructions:   Last dose of SGLT-2 inhibitors- N/A SGLT-2 instructions:   Blood Thinner Instructions:N/A Aspirin Instructions: Last Dose:  Activity level: Can go up a flight of stairs and activities of daily living without stopping and without chest pain and/or shortness of breath   Able to exercise without chest pain and/or shortness of breath  Anesthesia review:   Patient denies shortness of breath, fever, cough and chest pain at PAT appointment   Patient verbalized understanding of instructions that were given to them at the PAT appointment. Patient was also instructed that they will need to review over the PAT instructions again at home before surgery.

## 2024-02-17 NOTE — Telephone Encounter (Signed)
 Spoke with Ms. Haley Roach and relayed  message from Warner Mccreedy, NP that her pre-op labs resulted and her white blood cell count is slightly elevated. Pt denies fever, chills, sore throat, cough, ear ache and all urinary symptoms. Pt states she has not felt sick and she is without symptoms. Pt's albumin level is slightly low. Advised to make sure of adequate protein intake before surgery to assist with healing. Pt verbalized understanding and thanked the office for calling.

## 2024-02-17 NOTE — Telephone Encounter (Signed)
-----   Message from Doylene Bode sent at 02/17/2024 11:08 AM EDT ----- Please call and review preop lab results. Her white blood cell count is slightly elevated. Please review for any signs of infection. Also her albumin level is slightly low. If she could make sure she is eating protein before surgery to assist with healing. ----- Message ----- From: Interface, Lab In [place] Sent: 02/17/2024   8:57 AM EDT To: Doylene Bode, NP

## 2024-02-17 NOTE — Progress Notes (Signed)
 Lab. Results: Platelets 720 High

## 2024-02-20 ENCOUNTER — Telehealth: Payer: Self-pay | Admitting: *Deleted

## 2024-02-20 NOTE — Telephone Encounter (Signed)
 Telephone call to check on pre-operative status.  Patient compliant with pre-operative instructions.  Reinforced nothing to eat after midnight. Clear liquids until 0929. Patient to arrive at 1029.  No questions or concerns voiced.  Instructed to call for any needs.

## 2024-02-21 ENCOUNTER — Inpatient Hospital Stay (HOSPITAL_COMMUNITY)
Admission: RE | Admit: 2024-02-21 | Discharge: 2024-02-23 | DRG: 740 | Disposition: A | Discharge status: Home / Self Care | Attending: Psychiatry | Admitting: Psychiatry

## 2024-02-21 ENCOUNTER — Other Ambulatory Visit: Payer: Self-pay

## 2024-02-21 ENCOUNTER — Encounter (HOSPITAL_COMMUNITY)
Admission: RE | Disposition: A | Discharge status: Home / Self Care | Payer: Self-pay | Source: Home / Self Care | Attending: Psychiatry

## 2024-02-21 ENCOUNTER — Encounter (HOSPITAL_COMMUNITY): Payer: Self-pay | Admitting: Psychiatry

## 2024-02-21 ENCOUNTER — Ambulatory Visit (HOSPITAL_COMMUNITY)

## 2024-02-21 DIAGNOSIS — K660 Peritoneal adhesions (postprocedural) (postinfection): Secondary | ICD-10-CM | POA: Diagnosis present

## 2024-02-21 DIAGNOSIS — Z79899 Other long term (current) drug therapy: Secondary | ICD-10-CM

## 2024-02-21 DIAGNOSIS — R1900 Intra-abdominal and pelvic swelling, mass and lump, unspecified site: Secondary | ICD-10-CM | POA: Diagnosis present

## 2024-02-21 DIAGNOSIS — R140 Abdominal distension (gaseous): Secondary | ICD-10-CM | POA: Diagnosis present

## 2024-02-21 DIAGNOSIS — D3912 Neoplasm of uncertain behavior of left ovary: Secondary | ICD-10-CM | POA: Diagnosis not present

## 2024-02-21 DIAGNOSIS — R188 Other ascites: Secondary | ICD-10-CM | POA: Diagnosis present

## 2024-02-21 DIAGNOSIS — C541 Malignant neoplasm of endometrium: Secondary | ICD-10-CM

## 2024-02-21 DIAGNOSIS — I10 Essential (primary) hypertension: Secondary | ICD-10-CM | POA: Diagnosis present

## 2024-02-21 DIAGNOSIS — K5900 Constipation, unspecified: Secondary | ICD-10-CM | POA: Diagnosis present

## 2024-02-21 DIAGNOSIS — D0739 Carcinoma in situ of other female genital organs: Secondary | ICD-10-CM | POA: Diagnosis present

## 2024-02-21 DIAGNOSIS — Z87442 Personal history of urinary calculi: Secondary | ICD-10-CM

## 2024-02-21 DIAGNOSIS — N951 Menopausal and female climacteric states: Secondary | ICD-10-CM | POA: Diagnosis present

## 2024-02-21 DIAGNOSIS — Z8249 Family history of ischemic heart disease and other diseases of the circulatory system: Secondary | ICD-10-CM | POA: Diagnosis not present

## 2024-02-21 DIAGNOSIS — Z885 Allergy status to narcotic agent status: Secondary | ICD-10-CM

## 2024-02-21 DIAGNOSIS — R6881 Early satiety: Secondary | ICD-10-CM | POA: Diagnosis present

## 2024-02-21 HISTORY — PX: ROBOTIC ASSISTED TOTAL HYSTERECTOMY: SHX6085

## 2024-02-21 LAB — ABO/RH: ABO/RH(D): A POS

## 2024-02-21 LAB — POCT PREGNANCY, URINE: Preg Test, Ur: NEGATIVE

## 2024-02-21 LAB — TYPE AND SCREEN
ABO/RH(D): A POS
Antibody Screen: NEGATIVE

## 2024-02-21 MED ORDER — KETAMINE HCL 50 MG/5ML IJ SOSY
PREFILLED_SYRINGE | INTRAMUSCULAR | Status: AC
  Filled 2024-02-21: qty 5

## 2024-02-21 MED ORDER — LACTATED RINGERS IV SOLN: INTRAVENOUS | Status: DC

## 2024-02-21 MED ORDER — ONDANSETRON HCL 4 MG/2ML IJ SOLN: 4.0000 mg | Freq: Four times a day (QID) | INTRAMUSCULAR | Status: DC | PRN

## 2024-02-21 MED ORDER — MIDAZOLAM HCL 2 MG/2ML IJ SOLN
INTRAMUSCULAR | Status: AC
  Filled 2024-02-21: qty 2

## 2024-02-21 MED ORDER — ORAL CARE MOUTH RINSE: 15.0000 mL | Freq: Once | OROMUCOSAL | Status: AC

## 2024-02-21 MED ORDER — KCL IN DEXTROSE-NACL 20-5-0.45 MEQ/L-%-% IV SOLN
INTRAVENOUS | Status: DC
  Filled 2024-02-21: qty 1000

## 2024-02-21 MED ORDER — OXYCODONE HCL 5 MG PO TABS: 5.0000 mg | ORAL_TABLET | Freq: Once | ORAL | Status: DC | PRN

## 2024-02-21 MED ORDER — ENSURE ENLIVE PO LIQD
237.0000 mL | Freq: Two times a day (BID) | ORAL | Status: DC
Start: 2024-02-22 — End: 2024-02-23
  Administered 2024-02-22 – 2024-02-23 (×4): 237 mL via ORAL

## 2024-02-21 MED ORDER — FENTANYL CITRATE (PF) 100 MCG/2ML IJ SOLN
INTRAMUSCULAR | Status: AC
  Filled 2024-02-21: qty 2

## 2024-02-21 MED ORDER — HYDROMORPHONE HCL 1 MG/ML IJ SOLN: 0.5000 mg | INTRAMUSCULAR | Status: DC | PRN

## 2024-02-21 MED ORDER — ACETAMINOPHEN 500 MG PO TABS
1000.0000 mg | ORAL_TABLET | ORAL | Status: AC
  Administered 2024-02-21: 1000 mg via ORAL
  Filled 2024-02-21: qty 2

## 2024-02-21 MED ORDER — PHENOL 1.4 % MT LIQD
1.0000 | OROMUCOSAL | Status: DC | PRN
  Administered 2024-02-21: 1 via OROMUCOSAL
  Filled 2024-02-21: qty 177

## 2024-02-21 MED ORDER — LIDOCAINE HCL (CARDIAC) PF 100 MG/5ML IV SOSY
PREFILLED_SYRINGE | INTRAVENOUS | Status: DC | PRN
  Administered 2024-02-21: 60 mg via INTRAVENOUS

## 2024-02-21 MED ORDER — IBUPROFEN 400 MG PO TABS
600.0000 mg | ORAL_TABLET | Freq: Four times a day (QID) | ORAL | Status: DC
  Administered 2024-02-22 – 2024-02-23 (×6): 600 mg via ORAL
  Filled 2024-02-21 (×6): qty 1

## 2024-02-21 MED ORDER — MIDAZOLAM HCL 5 MG/5ML IJ SOLN
INTRAMUSCULAR | Status: DC | PRN
  Administered 2024-02-21: 2 mg via INTRAVENOUS

## 2024-02-21 MED ORDER — ENOXAPARIN SODIUM 40 MG/0.4ML IJ SOSY
40.0000 mg | PREFILLED_SYRINGE | INTRAMUSCULAR | Status: DC
  Administered 2024-02-22 – 2024-02-23 (×2): 40 mg via SUBCUTANEOUS
  Filled 2024-02-21 (×2): qty 0.4

## 2024-02-21 MED ORDER — ONDANSETRON HCL 4 MG PO TABS
4.0000 mg | ORAL_TABLET | Freq: Four times a day (QID) | ORAL | Status: DC | PRN
Start: 2024-02-21 — End: 2024-02-23

## 2024-02-21 MED ORDER — PROPOFOL 500 MG/50ML IV EMUL
INTRAVENOUS | Status: DC | PRN
  Administered 2024-02-21: 70 ug/kg/min via INTRAVENOUS

## 2024-02-21 MED ORDER — LIDOCAINE HCL (PF) 2 % IJ SOLN
INTRAMUSCULAR | Status: DC | PRN
  Administered 2024-02-21: 1.5 mg/kg/h via INTRADERMAL

## 2024-02-21 MED ORDER — POVIDONE-IODINE 10 % EX SWAB
2.0000 | Freq: Once | CUTANEOUS | Status: AC
Start: 2024-02-21 — End: 2024-02-21
  Administered 2024-02-21: 2 via TOPICAL

## 2024-02-21 MED ORDER — HEMOSTATIC AGENTS (NO CHARGE) OPTIME
TOPICAL | Status: DC | PRN
  Administered 2024-02-21: 1 via TOPICAL

## 2024-02-21 MED ORDER — DEXAMETHASONE SODIUM PHOSPHATE 10 MG/ML IJ SOLN
INTRAMUSCULAR | Status: AC
  Filled 2024-02-21: qty 1

## 2024-02-21 MED ORDER — BUPIVACAINE HCL (PF) 0.25 % IJ SOLN
INTRAMUSCULAR | Status: AC
  Filled 2024-02-21: qty 30

## 2024-02-21 MED ORDER — CEFAZOLIN SODIUM-DEXTROSE 2-4 GM/100ML-% IV SOLN
INTRAVENOUS | Status: AC
  Filled 2024-02-21: qty 100

## 2024-02-21 MED ORDER — PHENYLEPHRINE 80 MCG/ML (10ML) SYRINGE FOR IV PUSH (FOR BLOOD PRESSURE SUPPORT)
PREFILLED_SYRINGE | INTRAVENOUS | Status: DC | PRN
  Administered 2024-02-21: 240 ug via INTRAVENOUS
  Administered 2024-02-21: 160 ug via INTRAVENOUS
  Administered 2024-02-21: 80 ug via INTRAVENOUS

## 2024-02-21 MED ORDER — SUGAMMADEX SODIUM 200 MG/2ML IV SOLN
INTRAVENOUS | Status: DC | PRN
  Administered 2024-02-21: 200 mg via INTRAVENOUS

## 2024-02-21 MED ORDER — ONDANSETRON HCL 4 MG/2ML IJ SOLN
INTRAMUSCULAR | Status: AC
  Filled 2024-02-21: qty 2

## 2024-02-21 MED ORDER — PROPOFOL 500 MG/50ML IV EMUL
INTRAVENOUS | Status: AC
  Filled 2024-02-21: qty 50

## 2024-02-21 MED ORDER — METOPROLOL SUCCINATE ER 50 MG PO TB24
50.0000 mg | ORAL_TABLET | Freq: Every day | ORAL | Status: DC
  Administered 2024-02-21 – 2024-02-22 (×2): 50 mg via ORAL
  Filled 2024-02-21 (×2): qty 1

## 2024-02-21 MED ORDER — TRAMADOL HCL 50 MG PO TABS
100.0000 mg | ORAL_TABLET | Freq: Four times a day (QID) | ORAL | Status: DC | PRN
  Administered 2024-02-22: 100 mg via ORAL
  Filled 2024-02-21: qty 2

## 2024-02-21 MED ORDER — ROCURONIUM BROMIDE 100 MG/10ML IV SOLN
INTRAVENOUS | Status: DC | PRN
  Administered 2024-02-21: 50 mg via INTRAVENOUS
  Administered 2024-02-21 (×2): 20 mg via INTRAVENOUS

## 2024-02-21 MED ORDER — CHEWING GUM (ORBIT) SUGAR FREE
1.0000 | CHEWING_GUM | Freq: Three times a day (TID) | ORAL | Status: DC
  Administered 2024-02-22 – 2024-02-23 (×5): 1 via ORAL
  Filled 2024-02-21: qty 1

## 2024-02-21 MED ORDER — PROPOFOL 10 MG/ML IV BOLUS
INTRAVENOUS | Status: AC
  Filled 2024-02-21: qty 20

## 2024-02-21 MED ORDER — BUPIVACAINE LIPOSOME 1.3 % IJ SUSP
INTRAMUSCULAR | Status: DC | PRN
  Administered 2024-02-21: 20 mL

## 2024-02-21 MED ORDER — SCOPOLAMINE 1 MG/3DAYS TD PT72
1.0000 | MEDICATED_PATCH | TRANSDERMAL | Status: DC
  Administered 2024-02-21: 1.5 mg via TRANSDERMAL
  Filled 2024-02-21: qty 1

## 2024-02-21 MED ORDER — BUPIVACAINE LIPOSOME 1.3 % IJ SUSP
INTRAMUSCULAR | Status: AC
  Filled 2024-02-21: qty 20

## 2024-02-21 MED ORDER — BUPIVACAINE HCL (PF) 0.25 % IJ SOLN
INTRAMUSCULAR | Status: DC | PRN
  Administered 2024-02-21: 30 mL

## 2024-02-21 MED ORDER — 0.9 % SODIUM CHLORIDE (POUR BTL) OPTIME
TOPICAL | Status: DC | PRN
  Administered 2024-02-21: 3000 mL

## 2024-02-21 MED ORDER — SODIUM CHLORIDE (PF) 0.9 % IJ SOLN
INTRAMUSCULAR | Status: AC
  Filled 2024-02-21: qty 100

## 2024-02-21 MED ORDER — PREGABALIN 75 MG PO CAPS
75.0000 mg | ORAL_CAPSULE | Freq: Two times a day (BID) | ORAL | Status: DC
  Administered 2024-02-22 – 2024-02-23 (×3): 75 mg via ORAL
  Filled 2024-02-21 (×3): qty 1

## 2024-02-21 MED ORDER — FENTANYL CITRATE (PF) 100 MCG/2ML IJ SOLN
INTRAMUSCULAR | Status: DC | PRN
  Administered 2024-02-21 (×2): 50 ug via INTRAVENOUS

## 2024-02-21 MED ORDER — OXYCODONE HCL 5 MG/5ML PO SOLN: 5.0000 mg | Freq: Once | ORAL | Status: DC | PRN

## 2024-02-21 MED ORDER — ROCURONIUM BROMIDE 10 MG/ML (PF) SYRINGE
PREFILLED_SYRINGE | INTRAVENOUS | Status: AC
  Filled 2024-02-21: qty 10

## 2024-02-21 MED ORDER — HYDROXYZINE HCL 25 MG PO TABS
25.0000 mg | ORAL_TABLET | Freq: Every day | ORAL | Status: DC | PRN
Start: 2024-02-21 — End: 2024-02-23

## 2024-02-21 MED ORDER — LIDOCAINE HCL (PF) 2 % IJ SOLN
INTRAMUSCULAR | Status: AC
  Filled 2024-02-21: qty 5

## 2024-02-21 MED ORDER — ATORVASTATIN CALCIUM 20 MG PO TABS
20.0000 mg | ORAL_TABLET | Freq: Every day | ORAL | Status: DC
Start: 2024-02-21 — End: 2024-02-23
  Administered 2024-02-21 – 2024-02-22 (×2): 20 mg via ORAL
  Filled 2024-02-21 (×2): qty 1

## 2024-02-21 MED ORDER — ONDANSETRON HCL 4 MG/2ML IJ SOLN
INTRAMUSCULAR | Status: DC | PRN
Start: 2024-02-21 — End: 2024-02-21
  Administered 2024-02-21: 4 mg via INTRAVENOUS

## 2024-02-21 MED ORDER — DEXAMETHASONE SODIUM PHOSPHATE 10 MG/ML IJ SOLN
INTRAMUSCULAR | Status: DC | PRN
  Administered 2024-02-21: 10 mg via INTRAVENOUS

## 2024-02-21 MED ORDER — NON FORMULARY: 1.0000 [IU] | Freq: Three times a day (TID) | Status: DC

## 2024-02-21 MED ORDER — FENTANYL CITRATE PF 50 MCG/ML IJ SOSY: 25.0000 ug | PREFILLED_SYRINGE | INTRAMUSCULAR | Status: DC | PRN

## 2024-02-21 MED ORDER — CEFAZOLIN SODIUM-DEXTROSE 2-4 GM/100ML-% IV SOLN
2.0000 g | INTRAVENOUS | Status: AC
  Administered 2024-02-21 (×2): 2 g via INTRAVENOUS
  Filled 2024-02-21: qty 100

## 2024-02-21 MED ORDER — PHENYLEPHRINE 80 MCG/ML (10ML) SYRINGE FOR IV PUSH (FOR BLOOD PRESSURE SUPPORT)
PREFILLED_SYRINGE | INTRAVENOUS | Status: AC
  Filled 2024-02-21: qty 10

## 2024-02-21 MED ORDER — GABAPENTIN 300 MG PO CAPS
300.0000 mg | ORAL_CAPSULE | ORAL | Status: AC
  Administered 2024-02-21: 300 mg via ORAL
  Filled 2024-02-21: qty 1

## 2024-02-21 MED ORDER — KETAMINE HCL 50 MG/5ML IJ SOSY
PREFILLED_SYRINGE | INTRAMUSCULAR | Status: DC | PRN
  Administered 2024-02-21 (×2): 10 mg via INTRAVENOUS
  Administered 2024-02-21: 30 mg via INTRAVENOUS

## 2024-02-21 MED ORDER — CARMEX CLASSIC LIP BALM EX OINT
TOPICAL_OINTMENT | CUTANEOUS | Status: DC | PRN
  Filled 2024-02-21: qty 10

## 2024-02-21 MED ORDER — PROPOFOL 10 MG/ML IV BOLUS
INTRAVENOUS | Status: DC | PRN
  Administered 2024-02-21: 170 mg via INTRAVENOUS

## 2024-02-21 MED ORDER — AMITRIPTYLINE HCL 25 MG PO TABS
25.0000 mg | ORAL_TABLET | Freq: Every day | ORAL | Status: DC
Start: 2024-02-21 — End: 2024-02-23
  Administered 2024-02-21 – 2024-02-22 (×2): 25 mg via ORAL
  Filled 2024-02-21 (×2): qty 1

## 2024-02-21 MED ORDER — OXYCODONE HCL 5 MG PO TABS
5.0000 mg | ORAL_TABLET | ORAL | Status: DC | PRN
  Administered 2024-02-21 – 2024-02-22 (×2): 5 mg via ORAL
  Filled 2024-02-21 (×2): qty 1

## 2024-02-21 MED ORDER — SENNOSIDES-DOCUSATE SODIUM 8.6-50 MG PO TABS
2.0000 | ORAL_TABLET | Freq: Every day | ORAL | Status: DC
  Administered 2024-02-21 – 2024-02-22 (×2): 2 via ORAL
  Filled 2024-02-21 (×2): qty 2

## 2024-02-21 MED ORDER — LIDOCAINE HCL 2 % IJ SOLN
INTRAMUSCULAR | Status: AC
  Filled 2024-02-21: qty 20

## 2024-02-21 MED ORDER — DEXAMETHASONE SODIUM PHOSPHATE 4 MG/ML IJ SOLN: 4.0000 mg | INTRAMUSCULAR | Status: DC

## 2024-02-21 MED ORDER — CHLORHEXIDINE GLUCONATE 0.12 % MT SOLN
15.0000 mL | Freq: Once | OROMUCOSAL | Status: AC
  Administered 2024-02-21: 15 mL via OROMUCOSAL

## 2024-02-21 MED ORDER — SODIUM CHLORIDE (PF) 0.9 % IJ SOLN
INTRAMUSCULAR | Status: DC | PRN
  Administered 2024-02-21: 100 mL

## 2024-02-21 MED ORDER — ACETAMINOPHEN 500 MG PO TABS
1000.0000 mg | ORAL_TABLET | Freq: Four times a day (QID) | ORAL | Status: DC
Start: 2024-02-21 — End: 2024-02-23
  Administered 2024-02-21 – 2024-02-23 (×7): 1000 mg via ORAL
  Filled 2024-02-21 (×7): qty 2

## 2024-02-21 MED ORDER — HEPARIN SODIUM (PORCINE) 5000 UNIT/ML IJ SOLN
5000.0000 [IU] | INTRAMUSCULAR | Status: AC
Start: 2024-02-21 — End: 2024-02-21
  Administered 2024-02-21: 5000 [IU] via SUBCUTANEOUS
  Filled 2024-02-21: qty 1

## 2024-02-21 MED ORDER — DEXMEDETOMIDINE HCL IN NACL 80 MCG/20ML IV SOLN
INTRAVENOUS | Status: DC | PRN
  Administered 2024-02-21: 8 ug via INTRAVENOUS
  Administered 2024-02-21: 4 ug via INTRAVENOUS

## 2024-02-21 MED ORDER — PROPOFOL 1000 MG/100ML IV EMUL
INTRAVENOUS | Status: AC
  Filled 2024-02-21: qty 100

## 2024-02-21 NOTE — Anesthesia Procedure Notes (Signed)
 Procedure Name: Intubation Date/Time: 02/21/2024 12:40 PM  Performed by: Maurene Capes, CRNAPre-anesthesia Checklist: Patient identified, Emergency Drugs available, Suction available and Patient being monitored Patient Re-evaluated:Patient Re-evaluated prior to induction Oxygen Delivery Method: Circle System Utilized Preoxygenation: Pre-oxygenation with 100% oxygen Induction Type: IV induction Ventilation: Mask ventilation without difficulty Laryngoscope Size: Mac and 4 Grade View: Grade II Tube type: Oral Tube size: 7.0 mm Number of attempts: 1 Airway Equipment and Method: Stylet and Oral airway Placement Confirmation: ETT inserted through vocal cords under direct vision, positive ETCO2 and breath sounds checked- equal and bilateral Secured at: 21 cm Tube secured with: Tape Dental Injury: Teeth and Oropharynx as per pre-operative assessment

## 2024-02-21 NOTE — Op Note (Signed)
 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  Large approximately 30 by centimeter multilobulated cystic and solid mass filling the abdomen arising from the left ovary with evidence of torsion.  Few filmy adhesions from the omentum and the small bowel mesentery to the mass.  Otherwise normal upper abdominal survey with smooth diaphragm, liver, stomach.  Otherwise normal-appearing omentum, small bowel, and mesentery.  Normal appendix.  Normal-appearing uterus and right fallopian tube and ovary.  2 mm nodule on the left uterosacral ligament removed with uterine specimen.  Prominent left pelvic lymph node along the left external iliac vessels, removed. IOFS c/w mucinous borderline tumor.   Specimens:  ID Type Source Tests Collected by Time Destination  1 : Omentum Tissue PATH GI tumor resection SURGICAL PATHOLOGY Clide Cliff, MD 02/21/2024 1320   2 : Left Tube and Ovary Tissue PATH Gyn tumor resection SURGICAL PATHOLOGY Clide Cliff, MD 02/21/2024 1327   3 : Additional Left Adnexa Tissue PATH Gyn tumor resection SURGICAL PATHOLOGY Clide Cliff, MD 02/21/2024 1339   4 : small bowel mesenteric adhesion Tissue PATH Gyn tumor resection SURGICAL PATHOLOGY Clide Cliff, MD 02/21/2024  1348   5 : Right Tube and Ovary Tissue PATH Gyn tumor resection SURGICAL PATHOLOGY Clide Cliff, MD 02/21/2024 1356   6 : Uterus + Cervix Tissue PATH Gyn tumor resection SURGICAL PATHOLOGY Clide Cliff, MD 02/21/2024 1446   7 : Left Pelvic Lymph Node Tissue PATH Lymph node excision SURGICAL PATHOLOGY Clide Cliff, MD 02/21/2024 1501   8 : Left Pelvic Peritoneal Biopsy Tissue PATH Gyn biopsy SURGICAL PATHOLOGY Clide Cliff, MD 02/21/2024 1502   9 : Right Pelvic Peritoneal Biopsy Tissue PATH Gyn biopsy SURGICAL PATHOLOGY Clide Cliff, MD 02/21/2024 1503   10 : Anterior [place] Peritoneal Biopsy Tissue PATH Gyn biopsy SURGICAL PATHOLOGY Clide Cliff, MD 02/21/2024 1503   11 : Posterior [place] Peritoneal Biopsy Tissue PATH Gyn biopsy SURGICAL PATHOLOGY Clide Cliff, MD 02/21/2024 1504   12 : Left Pericolic [place] Peritoneal Biopsy Tissue PATH Gyn biopsy SURGICAL PATHOLOGY Clide Cliff, MD 02/21/2024 1505   13 : Right Pericolic [place]utter Peritoneal Biopsy Tissue PATH Gyn biopsy SURGICAL PATHOLOGY Clide Cliff, MD 02/21/2024 1505   A : Ascites Body Fluid PATH Cytology Peritoneal fluid CYTOLOGY - NON PAP Clide Cliff, MD 02/21/2024 1314     Complications:  None  Indications for Procedure: Haley Roach is a 53 y.o. woman with a large complex abdominal pelvic mass and elevated tumor markers.  Prior to the procedure, all risks, benefits, and alternatives were discussed and informed surgical consent was signed.  Procedure: Patient was taken to the operating room where general anesthesia was achieved.  She was positioned in dorsal lithotomy and prepped and draped.  A foley catheter was inserted into the bladder.  A vertical midline incision was made with the scalpel and the abdomen was entered sharply.  The abdomen and pelvis were surveyed with  findings as documented above.  Ascites was collected for cytology.    Thin adhesions of the mass to the omentum were cauterized and  transected with the LigaSure.  Additional then adhesions to the small bowel mesentery were additionally cauterized and transected with the LigaSure.  The mass was then elevated out of the incision.  The left ureter was identified transperitoneally.  A clamp was placed across the IP ligament and utero-ovarian ligament.  This pedicle was transected and the left tube and ovary were handed off the field for frozen pathology.  A wound protector was then placed.  The peritoneum overlying the left pelvic sidewall was then incised and the retroperitoneum entered.  The left ureter was identified retroperitoneally.  The remaining IP ligament was isolated, clamped, cauterized with the LigaSure device, and tied.  This was repeated at the utero-ovarian ligament with residual fallopian tube and IP ligament resected and handed off the field for permanent pathology.  On the right, the peritoneum was incised over the pelvic sidewall and the retroperitoneum entered.  The right ureter was identified.  The infundibulopelvic ligament was isolated, clamped, cauterized with the LigaSure device, transected, and tied.  The broad ligament was cauterized and transected towards the cornua with the LigaSure device.  The proximal fallopian tube and utero-ovarian ligament were then isolated, cauterized, and transected.  The right fallopian tube and ovary were handed off the field.  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  The Bookwalter retractor was placed and the bowels packed into the upper abdomen. The broad ligament on the right was opened anteriorly and the bladder flap was developed.  The uterine artery was skeletonized, clamped, transected, and suture ligated.  An identical procedure was performed on the contralateral side.  The posterior peritoneum on the left was incised to remove a small nodule and uterosacral ligament along with the uterine specimen.  Sequential clamps were used to  transect the remainder of the broad and cardinal ligaments.  Each pedicle was suture ligated.  Two curved clamps were placed below the cervix and the uterus and cervix were amputated from the vagina.  The vaginal cuff was closed with two Heaney stitches at the bilateral apices and several figure-of-eight stitches of 0 Vicryl in the midline.  The prominent lymph node on the left external iliac vessels was grasped and elevated.  Its connections to the underlying tissues were bluntly dissected and cauterized to remove the pelvic lymph node for permanent pathology.  Additional peritoneal biopsies were obtained by grasping the peritoneum with an Allis and removing the biopsy specimen with electrocautery.  The omentum was grasped and then brought down into the pelvis.  Inferior to the transverse colon, pedicles were made and the omentum.  These were cauterized and transected with the LigaSure cautery to perform an infracolic omentectomy for omental biopsy.  Hemostasis was noted all pedicles.  The bowel was inspected and noted to be normal with thin filmy adhesions on the small bowel mesentery which had likely been adherent to the pelvic mass.  These adhesions were cauterized and transected with LigaSure and handed off the field for pathology.  The abdomen and pelvis was irrigated.  Bleeding was noted at the right uterine vessels.  These were grasped and elevated and ligated with 0 Vicryl tie.  An additional area of small oozing was noted near the left uterosacral ligament.  A figure-of-eight stitch of 3-0 Vicryl was placed obtain hemostasis.  The pelvis was again irrigated.  Surgiflo was  placed at the cuff and hemostasis was achieved.  The fascia was closed with two running stitches of #1 PDS, tied separately in the midline. Exparel was injected at the incision site in standard fashion. The subcutaneous tissues were irrigated and hemostasis achieved. The subcutaneous space was approximated with 2-0 Vicryl running  stitches. The skin was closed with 4-0 Vicryl deep dermal running stitches and 4-0 monocryl in a subcuticular fashion followed by surgical glue.  Patient tolerated the procedure well. Sponge, lap, and instrument counts were correct.  2 gm of Ancef was administered prior to skin incision for routine perioperative antibiotics.  Patient was extubated and taken to the PACU in stable condition.  Clide Cliff, MD Gynecologic Oncology

## 2024-02-21 NOTE — Discharge Instructions (Signed)
 AFTER SURGERY INSTRUCTIONS   Return to work: 4-6 weeks if applicable   You may have a white honeycomb dressing over your larger incision. This dressing can be removed 5 days after surgery and you do not need to reapply a new dressing. Once you remove the dressing, you will notice that you have the surgical glue (dermabond) on the incision and this will peel off on its own. You can get this dressing wet in the shower the days after surgery prior to removal on the 5th day.    Activity: 1. Be up and out of the bed during the day.  Take a nap if needed.  You may walk up steps but be careful and use the hand rail.  Stair climbing will tire you more than you think, you may need to stop part way and rest.    2. No lifting or straining for 6 weeks over 10 pounds. No pushing, pulling, straining for 6 weeks.   3. No driving for 1-61 days when the following criteria have been met: Do not drive if you are taking narcotic pain medicine and make sure that your reaction time has returned.    4. You can shower as soon as the next day after surgery. Shower daily.  Use your regular soap and water (not directly on the incision) and pat your incision(s) dry afterwards; don't rub.  No tub baths or submerging your body in water until cleared by your surgeon. If you have the soap that was given to you by pre-surgical testing that was used before surgery, you do not need to use it afterwards because this can irritate your incisions.    5. No sexual activity and nothing in the vagina for 6 weeks, 10-12 weeks if you have a hysterectomy (removal of the uterus and cervix).   6. You may experience a small amount of clear drainage from your incisions, which is normal.  If the drainage persists, increases, or changes color please call the office.   7. Do not use creams, lotions, or ointments such as neosporin on your incisions after surgery until advised by your surgeon because they can cause removal of the dermabond glue on  your incisions.     8. You may experience vaginal spotting after surgery or when the stitches at the top of the vagina begin to dissolve (if you have a hysterectomy).  The spotting is normal but if you experience heavy bleeding, call our office.   9. Take Tylenol or ibuprofen first for pain if you are able to take these medications and only use narcotic pain medication for severe pain not relieved by the Tylenol or Ibuprofen.  Monitor your Tylenol intake to a max of 4,000 mg in a 24 hour period. You can alternate these medications after surgery.   Diet: 1. Low sodium Heart Healthy Diet is recommended but you are cleared to resume your normal (before surgery) diet after your procedure.   2. It is safe to use a laxative, such as Miralax or Colace, if you have difficulty moving your bowels before surgery. You have been prescribed Sennakot-S to take at bedtime every evening after surgery to keep bowel movements regular and to prevent constipation.     Wound Care: 1. Keep clean and dry.  Shower daily.   Reasons to call the Doctor: Fever - Oral temperature greater than 100.4 degrees Fahrenheit Foul-smelling vaginal discharge Difficulty urinating Nausea and vomiting Increased pain at the site of the incision that is unrelieved with  pain medicine. Difficulty breathing with or without chest pain New calf pain especially if only on one side Sudden, continuing increased vaginal bleeding with or without clots.   Contacts: For questions or concerns you should contact:   Dr. Clide Cliff at [phone]   Warner Mccreedy, NP at [phone]   After Hours: call [phone] and have the GYN Oncologist paged/contacted (after 5 pm or on the weekends). You will speak with an after hours RN and let he or she know you have had surgery.   Messages sent via mychart are for non-urgent matters and are not responded to after hours so for urgent needs, please call the after hours number.

## 2024-02-21 NOTE — Anesthesia Preprocedure Evaluation (Signed)
 Anesthesia Evaluation  Patient identified by MRN, date of birth, ID band Patient awake    Reviewed: Allergy & Precautions, H&P , NPO status , Patient's Chart, lab work & pertinent test results  History of Anesthesia Complications (+) PONV and history of anesthetic complications  Airway Mallampati: II   Neck ROM: full    Dental   Pulmonary asthma    breath sounds clear to auscultation       Cardiovascular hypertension,  Rhythm:regular Rate:Normal     Neuro/Psych  Headaches    GI/Hepatic   Endo/Other    Renal/GU stones     Musculoskeletal   Abdominal   Peds  Hematology   Anesthesia Other Findings   Reproductive/Obstetrics                             Anesthesia Physical Anesthesia Plan  ASA: 2  Anesthesia Plan: General   Post-op Pain Management:    Induction: Intravenous  PONV Risk Score and Plan: 4 or greater and Ondansetron, Dexamethasone, Midazolam, Treatment may vary due to age or medical condition and Scopolamine patch - Pre-op  Airway Management Planned: Oral ETT  Additional Equipment:   Intra-op Plan:   Post-operative Plan: Extubation in OR  Informed Consent: I have reviewed the patients History and Physical, chart, labs and discussed the procedure including the risks, benefits and alternatives for the proposed anesthesia with the patient or authorized representative who has indicated his/her understanding and acceptance.     Dental advisory given  Plan Discussed with: CRNA, Anesthesiologist and Surgeon  Anesthesia Plan Comments:        Anesthesia Quick Evaluation

## 2024-02-21 NOTE — Interval H&P Note (Signed)
 History and Physical Interval Note:  02/21/2024 12:17 PM  Rasheema A Weikel Sapusek  has presented today for surgery, with the diagnosis of Pelvic mass.  The various methods of treatment have been discussed with the patient and family. After consideration of risks, benefits and other options for treatment, the patient has consented to  Procedure(s) with comments: LAPAROSCOPY, DIAGNOSTIC (N/A) SALPINGO-OOPHORECTOMY, BILATERAL, ROBOT-ASSISTED (N/A) LAPAROTOMY (N/A) - small opening HYSTERECTOMY, TOTAL, ROBOT-ASSISTED (N/A) LAPAROTOMY, EXPLORATORY, FOR STAGING (N/A) as a surgical intervention. We did discuss that we will perform diagnostic laparoscopy and most likely proceed with laparotomy for ovary removal given elevated CA125 to minimize risk of rupture of the mass. The patient's history has been reviewed, patient examined, no change in status, stable for surgery.  I have reviewed the patient's chart and labs.  Questions were answered to the patient's satisfaction.     Epimenio Schetter

## 2024-02-21 NOTE — Transfer of Care (Signed)
 Immediate Anesthesia Transfer of Care Note  Patient: Haley Roach  Procedure(s) Performed: TOTAL ABDOMINAL HYSTERECTOMY, BILATERAL SALPINGO-OOPHORECTOMY, OMENTAL AND PERITONEAL BIOPIES, PELVIC LYMPH NODE SAMPLING  Patient Location: PACU  Anesthesia Type:General  Level of Consciousness: drowsy  Airway & Oxygen Therapy: Patient Spontanous Breathing and Patient connected to nasal cannula oxygen  Post-op Assessment: Report given to RN and Post -op Vital signs reviewed and stable  Post vital signs: Reviewed and stable  Last Vitals:  Vitals Value Taken Time  BP 133/90 02/21/24 1706  Temp    Pulse 91 02/21/24 1735  Resp 21 02/21/24 1735  SpO2 100 % 02/21/24 1735  Vitals shown include unfiled device data.  Last Pain:  Vitals:   02/21/24 1139  TempSrc:   PainSc: 0-No pain      Patients Stated Pain Goal: 4 (02/21/24 1119)  Complications: No notable events documented.

## 2024-02-22 ENCOUNTER — Encounter (HOSPITAL_COMMUNITY): Payer: Self-pay | Admitting: Psychiatry

## 2024-02-22 LAB — CBC
HCT: 35.8 % — ABNORMAL LOW (ref 36.0–46.0)
Hemoglobin: 11.5 g/dL — ABNORMAL LOW (ref 12.0–15.0)
MCH: 26.9 pg (ref 26.0–34.0)
MCHC: 32.1 g/dL (ref 30.0–36.0)
MCV: 83.6 fL (ref 80.0–100.0)
Platelets: 633 10*3/uL — ABNORMAL HIGH (ref 150–400)
RBC: 4.28 MIL/uL (ref 3.87–5.11)
RDW: 14.6 % (ref 11.5–15.5)
WBC: 13.7 10*3/uL — ABNORMAL HIGH (ref 4.0–10.5)
nRBC: 0 % (ref 0.0–0.2)

## 2024-02-22 LAB — BASIC METABOLIC PANEL WITH GFR
Anion gap: 8 (ref 5–15)
BUN: 8 mg/dL (ref 6–20)
CO2: 25 mmol/L (ref 22–32)
Calcium: 8.4 mg/dL — ABNORMAL LOW (ref 8.9–10.3)
Chloride: 102 mmol/L (ref 98–111)
Creatinine, Ser: 0.68 mg/dL (ref 0.44–1.00)
GFR, Estimated: >60 mL/min (ref >60)
Glucose, Bld: 210 mg/dL — ABNORMAL HIGH (ref 70–99)
Potassium: 4.1 mmol/L (ref 3.5–5.1)
Sodium: 135 mmol/L (ref 135–145)

## 2024-02-22 NOTE — Progress Notes (Signed)
 1 Day Post-Op Procedure(s) (LRB): TOTAL ABDOMINAL HYSTERECTOMY, BILATERAL SALPINGO-OOPHORECTOMY, OMENTAL AND PERITONEAL BIOPIES, PELVIC LYMPH NODE SAMPLING (N/A)  Subjective: Patient reports doing well this am. She ambulated in the halls earlier this am and tolerated this well. No nausea or emesis reported. Tolerated solid food this am. Pain is manageable. Starting to feel more discomfort on the right abdomen. Lajoyce Corners is in place and is helping with support. Also using ice. No flatus or BM reported. Would like to have catheter removed if possible. Denies chest pain, dyspnea, dizziness. Sister at the bedside. No concerns voiced.   Objective: Vital signs in last 24 hours: Temp:  [97.7 F (36.5 C)-98.7 F (37.1 C)] 97.8 F (36.6 C) (04/02 0540) Pulse Rate:  [84-100] 84 (04/02 0540) Resp:  [14-23] 16 (04/02 0540) BP: (133-174)/(81-100) 151/88 (04/02 0540) SpO2:  [96 %-100 %] 97 % (04/02 0540) Weight:  [182 lb (82.6 kg)] 182 lb (82.6 kg) (04/01 1050) Last BM Date : 02/21/24  Intake/Output from previous day: 04/01 0701 - 04/02 0700 In: 2459.6 [P.O.:420; I.V.:1839.6; IV Piggyback:200] Out: 2900 [Urine:2650; Blood:250]  Physical Examination: General: alert, cooperative, and no distress Resp: clear to auscultation bilaterally Cardio: regular rate and rhythm, S1, S2 normal, no murmur, click, rub or gallop GI: incision: midline abdominal incision with dermabond with op site dressing intact with no active drainage noted underneath and abdomen is soft, active bowel sounds, appropriately tender, binder in place Extremities: extremities normal, atraumatic, no cyanosis or edema SCDs on. Foley with clear, yellow urine.   Labs: WBC/Hgb/Hct/Plts:  13.7/11.5/35.8/633 (04/02 1308) BUN/Cr/glu/ALT/AST/amyl/lip:  8/0.68/--/--/--/--/-- (04/02 0447)  Assessment: 53 y.o. s/p Procedure(s): TOTAL ABDOMINAL HYSTERECTOMY, BILATERAL SALPINGO-OOPHORECTOMY, OMENTAL AND PERITONEAL BIOPIES, PELVIC LYMPH NODE  SAMPLING: stable Pain:  Pain is well-controlled on PRN medications.  Heme: Hgb 11.5 and Hct 35.9 this am. Appropriate given surgical losses and preop values.     ID: WBC 13.7-felt to be reactive. Given decadron and antibiotics intra-op. No evidence of infection.  CV: BP and HR stable. Continue to monitor with ordered vital signs.  GI:  Tolerating po: yes. Antiemetics ordered as needed.   GU: Adequate urine output overnight. Creatinine 0.68 this am. Plan for foley removal.      FEN: No critical values on am labs.   Endo: No hx of diabetes. Glucose on am labs 210. Previous glucose levels at 113, 136, 103.   Prophylaxis: SCDs on and lovenox ordered.   Plan: IV to saline lock Continue with diet as tolerated Foley removal Encourage increasing mobility, IS use Awaiting return of bowel function Possible discharge as soon as tomorrow if meeting milestones Continue with current plan of care   LOS: 1 day    Doylene Bode 02/22/2024, 7:55 AM

## 2024-02-22 NOTE — Progress Notes (Signed)
 GYN Oncology Progress Note  Patient is alert, oriented, sitting in chair, in no acute distress. Has ambulated in the halls three times. No nausea or emesis. Continues to tolerate her diet and taking in ensures as well. Starting to feel more abdominal soreness with this managed with oral pain meds. Due to void since foley removal. No flatus or BM. No needs voiced at this time. Continue with current plan of care.

## 2024-02-23 ENCOUNTER — Encounter (HOSPITAL_COMMUNITY)

## 2024-02-23 LAB — CBC
HCT: 35.5 % — ABNORMAL LOW (ref 36.0–46.0)
Hemoglobin: 11 g/dL — ABNORMAL LOW (ref 12.0–15.0)
MCH: 26.6 pg (ref 26.0–34.0)
MCHC: 31 g/dL (ref 30.0–36.0)
MCV: 85.7 fL (ref 80.0–100.0)
Platelets: 618 10*3/uL — ABNORMAL HIGH (ref 150–400)
RBC: 4.14 MIL/uL (ref 3.87–5.11)
RDW: 15 % (ref 11.5–15.5)
WBC: 13.4 10*3/uL — ABNORMAL HIGH (ref 4.0–10.5)
nRBC: 0 % (ref 0.0–0.2)

## 2024-02-23 LAB — BASIC METABOLIC PANEL WITH GFR
Anion gap: 9 (ref 5–15)
BUN: 14 mg/dL (ref 6–20)
CO2: 25 mmol/L (ref 22–32)
Calcium: 8.3 mg/dL — ABNORMAL LOW (ref 8.9–10.3)
Chloride: 103 mmol/L (ref 98–111)
Creatinine, Ser: 0.68 mg/dL (ref 0.44–1.00)
GFR, Estimated: >60 mL/min (ref >60)
Glucose, Bld: 100 mg/dL — ABNORMAL HIGH (ref 70–99)
Potassium: 4 mmol/L (ref 3.5–5.1)
Sodium: 137 mmol/L (ref 135–145)

## 2024-02-23 NOTE — Plan of Care (Signed)
   Problem: Education: Goal: Knowledge of General Education information will improve Description Including pain rating scale, medication(s)/side effects and non-pharmacologic comfort measures Outcome: Progressing   Problem: Health Behavior/Discharge Planning: Goal: Ability to manage health-related needs will improve Outcome: Progressing

## 2024-02-23 NOTE — Progress Notes (Signed)
   02/23/24 1000  TOC Brief Assessment  Insurance and Status Reviewed  Patient has primary care physician Yes  Home environment has been reviewed home with spouse  Prior level of function: independent  Prior/Current Home Services No current home services  Social Drivers of Health Review SDOH reviewed no interventions necessary  Readmission risk has been reviewed Yes  Transition of care needs no transition of care needs at this time

## 2024-02-23 NOTE — Anesthesia Postprocedure Evaluation (Signed)
 Anesthesia Post Note  Patient: Haley Roach  Procedure(s) Performed: TOTAL ABDOMINAL HYSTERECTOMY, BILATERAL SALPINGO-OOPHORECTOMY, OMENTAL AND PERITONEAL BIOPIES, PELVIC LYMPH NODE SAMPLING     Patient location during evaluation: PACU Anesthesia Type: General Level of consciousness: awake and alert Pain management: pain level controlled Vital Signs Assessment: post-procedure vital signs reviewed and stable Respiratory status: spontaneous breathing, nonlabored ventilation, respiratory function stable and patient connected to nasal cannula oxygen Cardiovascular status: blood pressure returned to baseline and stable Postop Assessment: no apparent nausea or vomiting Anesthetic complications: no   No notable events documented.  Last Vitals:  Vitals:   02/22/24 2051 02/23/24 0522  BP: (!) 154/86 (!) 143/94  Pulse: 93 85  Resp: 16 16  Temp: 36.6 C 36.6 C  SpO2: 96% 97%    Last Pain:  Vitals:   02/23/24 0843  TempSrc:   PainSc: 5                  Anush Wiedeman S

## 2024-02-23 NOTE — Progress Notes (Signed)
Reviewed written d/c orders with pt and all questions answered. Pt verbalized understanding. Pt left in stable condition with all belongings.

## 2024-02-23 NOTE — Discharge Summary (Signed)
 Physician Discharge Summary  Patient ID: Haley Roach MRN: [id number] DOB/AGE: [birth year] 53 y.o.  Admit date: 02/21/2024 Discharge date: 02/23/2024  Admission Diagnoses: Pelvic mass in female  Discharge Diagnoses:  Principal Problem:   Pelvic mass in female Active Problems:   Pelvic mass   Discharged Condition:  The patient is in good condition and stable for discharge.    Hospital Course: On 02/21/2024, the patient underwent the following: Procedure(s): TOTAL ABDOMINAL HYSTERECTOMY, BILATERAL SALPINGO-OOPHORECTOMY, OMENTAL AND PERITONEAL BIOPIES, PELVIC LYMPH NODE SAMPLING. The postoperative course was uneventful.  She was discharged to home on postoperative day 2 tolerating a regular diet, ambulating, voiding, passing flatus, pain controlled with oral medications.   Consults: None  Significant Diagnostic Studies: Labs  Treatments: Surgery: see above  Discharge Exam (from am exam): Blood pressure (!) 143/94, pulse 85, temperature 97.9 F (36.6 C), temperature source Oral, resp. rate 16, height 5\' 4"  (1.626 m), weight 182 lb (82.6 kg), SpO2 97%. General: alert, cooperative, and no distress Resp: clear to auscultation bilaterally Cardio: regular rate and rhythm, S1, S2 normal, no murmur, click, rub or gallop GI: incision: midline abdominal incision with dermabond with op site dressing intact with no active drainage noted underneath and abdomen is soft, active bowel sounds, appropriately tender, binder in place Extremities: extremities normal, atraumatic, no cyanosis or edema  Discharge to home    Discharge Instructions     Call MD for:  difficulty breathing, headache or visual disturbances   Complete by: As directed    Call MD for:  extreme fatigue   Complete by: As directed    Call MD for:  hives   Complete by: As directed    Call MD for:  persistant dizziness or light-headedness   Complete by: As directed    Call MD for:  persistant nausea and vomiting    Complete by: As directed    Call MD for:  redness, tenderness, or signs of infection (pain, swelling, redness, odor or green/yellow discharge around incision site)   Complete by: As directed    Call MD for:  severe uncontrolled pain   Complete by: As directed    Call MD for:  temperature >100.4   Complete by: As directed    Diet - low sodium heart healthy   Complete by: As directed    Discharge wound care:   Complete by: As directed    You will have a white honeycomb dressing over your larger incision. This dressing can be removed 5 days after surgery and you do not need to reapply a new dressing. Once you remove the dressing, you will notice that you have the surgical glue (dermabond) on the incision and this will peel off on its own. You can get this dressing wet in the shower the days after surgery prior to removal on the 5th day.   Driving Restrictions   Complete by: As directed    No driving for around 5-78 days based on when the following criteria have been met: Do not take narcotics and drive. You need to make sure your reaction time has returned.   Increase activity slowly   Complete by: As directed    Lifting restrictions   Complete by: As directed    No lifting greater than 10 lbs, pushing, pulling, straining for 6 weeks.   Sexual Activity Restrictions   Complete by: As directed    No sexual activity, nothing in the vagina, for 10-12 weeks.      Allergies as  of 02/23/2024       Reactions   Codeine Nausea And Vomiting        Medication List     STOP taking these medications    senna 8.6 MG tablet Commonly known as: SENOKOT       TAKE these medications    amitriptyline 25 MG tablet Commonly known as: ELAVIL Take 25 mg by mouth at bedtime.   atorvastatin 20 MG tablet Commonly known as: LIPITOR Take 20 mg by mouth at bedtime.   furosemide 20 MG tablet Commonly known as: LASIX Take 20 mg by mouth daily.   hydrOXYzine 25 MG tablet Commonly known as:  ATARAX Take 25 mg by mouth daily as needed for anxiety (sleep).   losartan 50 MG tablet Commonly known as: COZAAR Take 50 mg by mouth daily.   metoprolol succinate 50 MG 24 hr tablet Commonly known as: TOPROL-XL Take 50 mg by mouth daily.   multivitamin with minerals tablet Take 1 tablet by mouth daily.   PROBIOTIC DAILY PO Take 1 capsule by mouth daily.   senna-docusate 8.6-50 MG tablet Commonly known as: Senokot-S Take 2 tablets by mouth at bedtime. For AFTER surgery, do not take if having diarrhea   traMADol 50 MG tablet Commonly known as: ULTRAM Take 1 tablet (50 mg total) by mouth every 6 (six) hours as needed for severe pain (pain score 7-10). For AFTER surgery only, do not take and drive               Discharge Care Instructions  (From admission, onward)           Start     Ordered   02/23/24 0000  Discharge wound care:       Comments: You will have a white honeycomb dressing over your larger incision. This dressing can be removed 5 days after surgery and you do not need to reapply a new dressing. Once you remove the dressing, you will notice that you have the surgical glue (dermabond) on the incision and this will peel off on its own. You can get this dressing wet in the shower the days after surgery prior to removal on the 5th day.   02/23/24 1350            Follow-up Information     Clide Cliff, MD Follow up on 03/12/2024.   Specialty: Gynecologic Oncology Why: at 8:45am at the [facility] information: [address] [place] [address] [phone]                 Greater than thirty minutes were spend for face to face discharge instructions and discharge orders/summary in EPIC.   Signed: Doylene Bode 02/23/2024, 1:53 PM

## 2024-02-23 NOTE — Progress Notes (Signed)
 2 Days Post-Op Procedure(s) (LRB): TOTAL ABDOMINAL HYSTERECTOMY, BILATERAL SALPINGO-OOPHORECTOMY, OMENTAL AND PERITONEAL BIOPIES, PELVIC LYMPH NODE SAMPLING (N/A)  Subjective: Patient reports continuing to doing well. She slept well last pm. She continues to ambulate in the halls and is tolerating this well. No nausea or emesis reported. Continues to tolerate her diet. Pain is manageable and more soreness with getting in and out of bed. No flatus or BM reported. Voiding without difficulty.  Denies chest pain, dyspnea, dizziness. No concerns voiced.   Objective: Vital signs in last 24 hours: Temp:  [97.8 F (36.6 C)-98.4 F (36.9 C)] 97.9 F (36.6 C) (04/03 0522) Pulse Rate:  [85-96] 85 (04/03 0522) Resp:  [16-18] 16 (04/03 0522) BP: (141-154)/(86-94) 143/94 (04/03 0522) SpO2:  [94 %-100 %] 97 % (04/03 0522) Last BM Date : 02/21/24  Intake/Output from previous day: 04/02 0701 - 04/03 0700 In: 629.2 [P.O.:475; I.V.:154.2] Out: 1975 [Urine:1975]  Physical Examination: General: alert, cooperative, and no distress Resp: clear to auscultation bilaterally Cardio: regular rate and rhythm, S1, S2 normal, no murmur, click, rub or gallop GI: incision: midline abdominal incision with dermabond with op site dressing intact with no active drainage noted underneath and abdomen is soft, active bowel sounds, appropriately tender, binder in place Extremities: extremities normal, atraumatic, no cyanosis or edema SCDs on.   Labs: WBC/Hgb/Hct/Plts:  13.4/11.0/35.5/618 (04/03 4098) BUN/Cr/glu/ALT/AST/amyl/lip:  14/0.68/--/--/--/--/-- (04/03 0458)  Assessment: 53 y.o. s/p Procedure(s): TOTAL ABDOMINAL HYSTERECTOMY, BILATERAL SALPINGO-OOPHORECTOMY, OMENTAL AND PERITONEAL BIOPIES, PELVIC LYMPH NODE SAMPLING: stable Pain:  Pain is well-controlled on PRN medications.  Heme: Hgb 11.0 from 11.5 and Hct 35.5 from 35.9- overall stable. Appropriate given surgical losses and preop values.     ID: WBC 13.4  from 13.7-felt to be reactive. Given decadron and antibiotics intra-op. No evidence of infection.  CV: BP and HR stable. Continue to monitor with ordered vital signs.  GI:  Tolerating po: yes. Antiemetics ordered as needed.   GU: Adequate urine output overnight. Creatinine 0.68 this am. Voiding after foley removal on 4/2.     FEN: No critical values on am labs.   Endo: No hx of diabetes. Glucose down to 100 on am labs.   Prophylaxis: SCDs on and lovenox ordered.   Plan: Continue with diet as tolerated Encourage continued mobility, IS use Awaiting return of bowel function Possible discharge later today or tomorrow if meeting milestones with evidence of bowel return Continue with current plan of care   LOS: 2 days    Doylene Bode 02/23/2024, 9:42 AM

## 2024-02-24 ENCOUNTER — Telehealth: Payer: Self-pay | Admitting: Surgery

## 2024-02-24 NOTE — Telephone Encounter (Signed)
 Spoke with Ms. Avaley Coop this morning. She states she is eating, drinking and urinating well. She has not had a BM yet but is passing gas. She is taking senokot as prescribed. Advised her to drink plenty of water and to increase her senokot to twice daily. She denies fever or chills. Incisions are dry and intact. She rates her pain 2/10. Her pain is controlled with Tramadol, Tylenol, and Ibuprofen.     Instructed to call office with any fever, chills, purulent drainage, uncontrolled pain or any other questions or concerns. Patient verbalizes understanding.   Pt aware of post op appointments as well as the office number [phone] and after hours number [phone] to call if she has any questions or concerns

## 2024-02-27 LAB — CYTOLOGY - NON PAP

## 2024-02-27 LAB — SURGICAL PATHOLOGY

## 2024-03-05 ENCOUNTER — Telehealth: Payer: Self-pay | Admitting: Psychiatry

## 2024-03-05 NOTE — Telephone Encounter (Signed)
 Called pt with pathology results. 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 Pt recovering well. Follow-up as scheduled next week.

## 2024-03-06 ENCOUNTER — Encounter: Payer: Self-pay | Admitting: Oncology

## 2024-03-06 DIAGNOSIS — R1900 Intra-abdominal and pelvic swelling, mass and lump, unspecified site: Secondary | ICD-10-CM

## 2024-03-06 NOTE — Progress Notes (Signed)
 Email sent to [facility] to obtain the size of the endometrial cancer tumor on accession [id number] per Dr. Daisey Dryer.

## 2024-03-12 ENCOUNTER — Encounter: Payer: Self-pay | Admitting: Gynecologic Oncology

## 2024-03-12 ENCOUNTER — Inpatient Hospital Stay: Attending: Psychiatry | Admitting: Psychiatry

## 2024-03-12 ENCOUNTER — Encounter: Payer: Self-pay | Admitting: Psychiatry

## 2024-03-12 ENCOUNTER — Inpatient Hospital Stay

## 2024-03-12 VITALS — BP 133/73 | HR 69 | Temp 98.0°F | Resp 16 | Ht 64.0 in | Wt 173.0 lb

## 2024-03-12 DIAGNOSIS — R3915 Urgency of urination: Secondary | ICD-10-CM | POA: Diagnosis not present

## 2024-03-12 DIAGNOSIS — R1900 Intra-abdominal and pelvic swelling, mass and lump, unspecified site: Secondary | ICD-10-CM

## 2024-03-12 DIAGNOSIS — Z90710 Acquired absence of both cervix and uterus: Secondary | ICD-10-CM | POA: Insufficient documentation

## 2024-03-12 DIAGNOSIS — C541 Malignant neoplasm of endometrium: Secondary | ICD-10-CM | POA: Insufficient documentation

## 2024-03-12 DIAGNOSIS — Z90722 Acquired absence of ovaries, bilateral: Secondary | ICD-10-CM | POA: Insufficient documentation

## 2024-03-12 DIAGNOSIS — R300 Dysuria: Secondary | ICD-10-CM

## 2024-03-12 DIAGNOSIS — Z9079 Acquired absence of other genital organ(s): Secondary | ICD-10-CM | POA: Insufficient documentation

## 2024-03-12 DIAGNOSIS — D3912 Neoplasm of uncertain behavior of left ovary: Secondary | ICD-10-CM | POA: Diagnosis present

## 2024-03-12 DIAGNOSIS — Z7189 Other specified counseling: Secondary | ICD-10-CM

## 2024-03-12 LAB — URINALYSIS, COMPLETE (UACMP) WITH MICROSCOPIC
Bacteria, UA: NONE SEEN
Bilirubin Urine: NEGATIVE
Glucose, UA: NEGATIVE mg/dL
Ketones, ur: NEGATIVE mg/dL
Nitrite: NEGATIVE
Protein, ur: NEGATIVE mg/dL
Specific Gravity, Urine: 1.004 — ABNORMAL LOW (ref 1.005–1.030)
pH: 7 (ref 5.0–8.0)

## 2024-03-12 NOTE — Progress Notes (Signed)
 Gynecologic Oncology Return Clinic Visit  Date of Service: 03/12/2024 Referring Provider: Darnelle Elders, PA-C [address] [place],  [place] [address]  Assessment & Plan: Haley Roach is a 53 y.o. 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  Postop: - Pt recovering well from surgery and healing appropriately postoperatively - Intraoperative findings and pathology results reviewed. - Ongoing postoperative expectations and precautions reviewed. Continue with no lifting >10lbs through 6 weeks postoperatively - Pt works in education. Okay to return to work at 6 weeks.  Endometrial cancer: - Reviewed pathology - Incompletely staged due to incidental finding. Only grossly abnormal lymph node removed in the context of the adnexal mass and benign. No enlarged lymph nodes on preop imaging. - No high-intermediate risk factors. - Recommend observation. - Will plan CT scan in 77mo given no lymph node assessment. Can repeat after that and if continues to be normal will discontinue ct and scan based on exam, symptoms - Signs/symptoms of recurrence reviewed. - Surveillance reviewed. Follow-up q6 months x2 years then yearly  Ovarian borderline tumor: - Pathology reviewed - No well established surveillance - Plan q87mo x2 years then yearly - CA125 is a marker for her and will follow  Urinary urgency: - No burning - UA today overall reassuring. Precautions reveiwed   RTC 77mo with CT and CA125 prior.  Derrel Flies, MD Gynecologic Oncology   Medical Decision Making I personally spent  TOTAL 20 minutes face-to-face and non-face-to-face in the care of this patient, which includes all pre, intra, and post visit time on the date of  service. The discussion of endometrial cancer and ovarian borderline tumor is beyond the scope of routine postoperative care.   ----------------------- Reason for Visit: Postop/Treatment Discussions  Treatment History: Ovarian borderline tumor - CT A/P (01/06/24) IMPRESSION: 1. Large cystic and solid mass arising in the pelvis and extending to the supraumbilical abdomen, measuring roughly 27 x 14 x 20.8 cm and is suspicious for ovarian neoplasm. Small volume ascites in the pelvis. Mild haziness and stranding in the omentum raising concern for early omental disease. 2. Minimal right hydronephrosis with otherwise normal caliber ureter and no obstructing stone. 3. Aortic atherosclerosis.  - Tumor markers (01/23/24): CA125 313; CEA 1.00;  CA19.9 - 15  - Surgery (02/21/24): Total abdominal hysterectomy, bilateral salpingo-oophorectomy, omental and peritoneal biopsies, left pelvic lymph node removal   OVARY or FALLOPIAN TUBE or PRIMARY PERITONEUM: Resection Procedure: Salpingo-oophorectomy Specimen Integrity: Intact Tumor Site: Left ovary Tumor Size: Borderline tumor 30 cm with multifocal intraepithelial carcinoma Histologic Type: Borderline tumor with intraepithelial carcinoma Histologic Grade: NA Ovarian Surface Involvement: Not identified Fallopian Tube Surface Involvement: Not identified Implants: Not applicable Lymphatic and/or Vascular Invasion:  Not identified Other Tissue/ Organ Involvement: Not applicable Largest Extrapelvic Peritoneal Focus: Not applicable Peritoneal/Ascitic Fluid Involvement: Not identified Chemotherapy Response Score (CRS): Not applicable, no known presurgical therapy Regional Lymph Nodes:      Number of Nodes with Metastasis Greater than 10 mm: 0      Number of Nodes with Metastasis 10 mm or Less (excludes isolated tumor cells): 0      Number of Nodes with Isolated Tumor Cells (0.2 mm or less): 0      Number of Lymph Nodes Examined: 1 Distant  Metastasis:  Distant Site(s) Involved: Not applicable Pathologic Stage Classification (pTNM, AJCC 8th Edition): pT1a, pN0   Cytology: FINAL MICROSCOPIC DIAGNOSIS:  - No malignant cells identified   Oncology History  Endometrial cancer (HCC)  02/21/2024 Cancer Staging   Staging form: Corpus Uteri - Carcinoma and Carcinosarcoma, AJCC 8th Edition and FIGO 2023 - Pathologic stage from 02/21/2024: FIGO Stage IA2, calculated as Stage Unknown (pT1a, pNX, cM0, MMRd-, p53-) - Signed by Derrel Flies, MD on 03/12/2024 Histopathologic type: Endometrioid adenocarcinoma, NOS Stage prefix: Initial diagnosis Histologic grade (G): G1 Histologic grading system: 3 grade system   02/21/2024 Initial Diagnosis   Endometrial cancer (HCC) Incidental finding from surgery for ovarian mass   02/21/2024 Surgery   Total abdominal hysterectomy, bilateral salpingo-oophorectomy, omental and peritoneal biopsies, left pelvic lymph node removal  Findings: On entry to abdomen, small volume straw yellow ascites encountered.  Large approximately 30 by centimeter multilobulated cystic and solid mass filling the abdomen arising from the left ovary with evidence of torsion.  Few filmy adhesions from the omentum and the small bowel mesentery to the mass.  Otherwise normal upper abdominal survey with smooth diaphragm, liver, stomach.  Otherwise normal-appearing omentum, small bowel, and mesentery.  Normal appendix.  Normal-appearing uterus and right fallopian tube and ovary.  2 mm nodule on the left uterosacral ligament removed with uterine specimen.  Prominent left pelvic lymph node along the left external iliac vessels, removed. IOFS c/w mucinous borderline tumor.    02/21/2024 Pathology Results   A.   FALLOPIAN TUBE AND OVARY, LEFT, SALPINGO-OOPHARECTOMY: Mucinous borderline tumor with intraepithelial carcinoma. Tumor site: Left ovary. Tumor size: Borderline tumor: 30 cm.              Intraepithelial carcinoma:  multifocal Benign fimbriated fallopian tube without diagnostic alteration. No evidence of invasion. See oncology table 1.  B.   OMENTUM: Benign adipose tissue, negative for malignancy.  C.   ADDITIONAL ADNEXA, LEFT, EXCISION: Benign vascular and adipose tissue. No ovarian parenchyma identified. Negative for neoplasm or malignancy.  D.   SMALL BOWEL MESENTERIC ADHESION, EXCISION: Benign fibrovascular tissue with focal chronic inflammation and reactive changes. Negative for malignancy.  E.   FALLOPAIN TUBE, OVARY, RIGHT, SALPINGO-OOPHORECTOMY: Benign ovarian parenchyma without diagnostic alteration. Benign fimbriated fallopian tube with paratubal cyst. Negative for malignancy.  F.   UTERUS, CERVIX, HYSTERECTOMY: Endometrioid adenocarcinoma, FIGO grade 1. Carcinoma superficially invades 6% of myometrium (1 mm / 16 mm). No lymphovascular invasion identified. See oncology table 2.  Benign cervix without diagnostic alteration. Benign endocervix with focal microglandular hyperplasia. Myometrium with leiomyomata. Serosa unremarkable.  G.   LYMPH NODE, LEFT PELVIC, EXCISION: One lymph node, negative for metastatic carcinoma (0/1).  H.   PERITONEAL, LEFT PELVIC, BIOPSY: Fibroadipose tissue with acute inflammation and hemorrhage. Negative for malignancy.  I.   PERITONEAL, RIGHT PELVIC, BIOPSY: Fibroadipose tissue with acute inflammation and hemorrhage. Negative for malignancy.  J.   PERITONEAL, ANTERIOR CULDESAC, BIOPSY: Fibroadipose tissue with chronic inflammation and hemorrhage. Negative for malignancy.  K.   PERITONEAL, POSTERIOR CULDESAC, BIOPSY: Fibroadipose tissue with chronic inflammation and hemorrhage. Negative for malignancy.  L.   PERITONEAL, RIGHT PERICOLIC GUTTER, BIOPSY: Fibroadipose tissue with chronic inflammation and hemorrhage. Negative for malignancy.   UTERUS, CARCINOMA OR CARCINOSARCOMA: Resection  Procedure: Total hysterectomy Histologic Type:  Endometrioid adenocarcinoma Histologic Grade: FIGO grade 1 Myometrial Invasion:      Depth of Myometrial Invasion (mm): 1 mm      Myometrial Thickness (mm): 16 mm      Percentage of  Myometrial Invasion: 6% Uterine Serosa Involvement: Not identified Cervical stromal Involvement: Not identified Extent of involvement of other tissue/organs: Not identified Peritoneal/Ascitic Fluid: Not involved Lymphovascular Invasion: Not identified Regional Lymph Nodes:      Pelvic Lymph Nodes Examined:          [0] Sentinel          [1] Non-sentinel          [1] Total      Pelvic Lymph Nodes with Metastasis: 0          Macrometastasis: (>2.0 mm): 0          Micrometastasis: (>0.2 mm and < 2.0 mm): 0          Isolated Tumor Cells (<0.2 mm): 0          Laterality of Lymph Node with Tumor: 0          Extracapsular Extension: NA      Para-aortic Lymph Nodes Examined:          [0] Sentinel          [0] Non-sentinel          [0] Total      Para-aortic Lymph Nodes with Metastasis: NA          Macrometastasis: (>2.0 mm): NA          Micrometastasis:  (>0.2 mm and < 2.0 mm): NA          Isolated Tumor Cells (<0.2 mm): NA          Laterality of Lymph Node with Tumor: NA          Extracapsular Extension: NA Distant Metastasis:      Distant Site(s) Involved: Not applicable Pathologic Stage Classification (pTNM, AJCC 8th Edition): pT1a, pN0 Ancillary Studies: MMR / MSI testing will be ordered Representative Tumor Block: F5, F4 Comment(s): See comment ([id number])  COMMENT:  Selected slides of the ovarian mass and the endometrium were peer-reviewed by Dr. Kavin Parsley who agrees with the diagnosis of mucinous borderline tumor with intraepithelial carcinoma and endometrioid carcinoma, FIGO grade 1.   IHC EXPRESSION RESULTS TEST           RESULT MLH1:          Preserved nuclear expression MSH2:          Preserved nuclear expression MSH6:          Preserved nuclear expression PMS2:          Preserved  nuclear expression   Immunohistochemical stains show the tumor cells are focally and weakly  positive for ER. P53 show rare weak nuclear positivity / wild type  pattern of staining.      Interval History: Pt reports that she is recovering well from surgery. She is using tylenol , aleve, prn for pain. She is eating and drinking well. She is voiding without issue and having regular bowel movements. Some urinary urgency this past week but no burning/pain. No bleeding currently.   Past Medical/Surgical History: Past Medical History:  Diagnosis Date   Asthma    exercise induced asthma as a teenager   Headache    migraines   History of kidney stones    Hypertension    PONV (postoperative nausea and vomiting)    after tonsillectomy    Past Surgical History:  Procedure Laterality Date   EXCISION OF KELOID Right 03/16/2018   Procedure: EXCISION OF LOWER RIGHT BACK MASS;  Surgeon: Lockie Rima, MD;  Location: [facility];  Service:  General;  Laterality: Right;   EXTRACORPOREAL SHOCK WAVE LITHOTRIPSY Right 01/31/2020   Procedure: EXTRACORPOREAL SHOCK WAVE LITHOTRIPSY (ESWL);  Surgeon: Erman Hayward, MD;  Location: [facility];  Service: Urology;  Laterality: Right;   ROBOTIC ASSISTED TOTAL HYSTERECTOMY N/A 02/21/2024   Procedure: TOTAL ABDOMINAL HYSTERECTOMY, BILATERAL SALPINGO-OOPHORECTOMY, OMENTAL AND PERITONEAL BIOPIES, PELVIC LYMPH NODE SAMPLING;  Surgeon: Derrel Flies, MD;  Location: [facility];  Service: Gynecology;  Laterality: N/A;   TONSILLECTOMY     uterine ablation     WISDOM TOOTH EXTRACTION      Family History  Problem Relation Age of Onset   Hypertension Mother    Lung cancer Maternal Grandmother    Breast cancer Neg Hx    Ovarian cancer Neg Hx    Endometrial cancer Neg Hx    Prostate cancer Neg Hx    Colon cancer Neg Hx    Pancreatic cancer Neg Hx     Social History   Socioeconomic History   Marital status: Married    Spouse name: Not on file    Number of children: Not on file   Years of education: Not on file   Highest education level: Not on file  Occupational History   Not on file  Tobacco Use   Smoking status: Never    Passive exposure: Never   Smokeless tobacco: Never  Vaping Use   Vaping status: Never Used  Substance and Sexual Activity   Alcohol use: Yes    Comment: rarely   Drug use: Never   Sexual activity: Yes  Other Topics Concern   Not on file  Social History Narrative   Not on file   Social Drivers of Health   Financial Resource Strain: Not on file  Food Insecurity: Patient Declined (02/21/2024)   Hunger Vital Sign    Worried About Running Out of Food in the Last Year: Patient declined    Ran Out of Food in the Last Year: Patient declined  Transportation Needs: Patient Declined (02/21/2024)   PRAPARE - Administrator, Civil Service (Medical): Patient declined    Lack of Transportation (Non-Medical): Patient declined  Physical Activity: Not on file  Stress: Not on file  Social Connections: Unknown (02/21/2024)   Social Connection and Isolation Panel [NHANES]    Frequency of Communication with Friends and Family: Patient declined    Frequency of Social Gatherings with Friends and Family: Patient declined    Attends Religious Services: Patient declined    Database administrator or Organizations: Patient declined    Attends Banker Meetings: Patient declined    Marital Status: Married    Current Medications:  Current Outpatient Medications:    amitriptyline  (ELAVIL ) 25 MG tablet, Take 25 mg by mouth at bedtime., Disp: , Rfl:    atorvastatin  (LIPITOR) 20 MG tablet, Take 20 mg by mouth at bedtime., Disp: , Rfl:    hydrOXYzine  (ATARAX ) 25 MG tablet, Take 25 mg by mouth daily as needed for anxiety (sleep)., Disp: , Rfl:    losartan (COZAAR) 50 MG tablet, Take 50 mg by mouth daily., Disp: , Rfl:    metoprolol  succinate (TOPROL -XL) 50 MG 24 hr tablet, Take 50 mg by mouth daily., Disp: ,  Rfl:    Probiotic Product (PROBIOTIC DAILY PO), Take 1 capsule by mouth daily., Disp: , Rfl:    senna-docusate (SENOKOT-S) 8.6-50 MG tablet, Take 2 tablets by mouth at bedtime. For AFTER surgery, do not take if having diarrhea, Disp: 30 tablet,  Rfl: 0   Multiple Vitamins-Minerals (MULTIVITAMIN WITH MINERALS) tablet, Take 1 tablet by mouth daily. (Patient not taking: Reported on 03/07/2024), Disp: , Rfl:    traMADol  (ULTRAM ) 50 MG tablet, Take 1 tablet (50 mg total) by mouth every 6 (six) hours as needed for severe pain (pain score 7-10). For AFTER surgery only, do not take and drive (Patient not taking: Reported on 03/07/2024), Disp: 10 tablet, Rfl: 0  Review of Symptoms: Complete 10-system review is negative except as above in Interval History.  Physical Exam: BP 133/73 (BP Location: Left Arm, Patient Position: Sitting)   Pulse 69   Temp 98 F (36.7 C) (Oral)   Resp 16   Ht 5\' 4"  (1.626 m)   Wt 173 lb (78.5 kg)   SpO2 100%   BMI 29.70 kg/m  General: Alert, oriented, no acute distress. HEENT: Normocephalic, atraumatic. Neck symmetric without masses. Sclera anicteric.  Chest: Normal work of breathing. Clear to auscultation bilaterally.   Cardiovascular: Regular rate and rhythm, no murmurs. Abdomen: Soft, nontender.  Normoactive bowel sounds.  No masses appreciated.  Well-healing incision. Extremities: Grossly normal range of motion.  Warm, well perfused.  No edema bilaterally. Skin: No rashes or lesions noted. GU: Normal appearing external genitalia without erythema, excoriation, or lesions.  Speculum exam reveals intact vaginal cuff, maroon at apex without any evidence of active bleedign.  Bimanual exam reveals intact vaginal cuff with some induration at cuff, no tenderness. Exam chaperoned by Kimberly [place], CMA   Laboratory & Radiologic Studies: See above in Treatment history

## 2024-03-12 NOTE — Patient Instructions (Signed)
 It was a pleasure to see you in clinic today. - No lifting >10lb until 6 weeks postop. Nothing in the vagina until 10wks postop - Return visit planned for 72mo with CT and CA125 lab prior.  Thank you very much for allowing me to provide care for you today.  I appreciate your confidence in choosing our Gynecologic Oncology team at [facility].  If you have any questions about your visit today please call our office or send us  a MyChart message and we will get back to you as soon as possible.

## 2024-03-19 ENCOUNTER — Encounter: Admitting: Psychiatry

## 2024-04-20 ENCOUNTER — Telehealth: Payer: Self-pay | Admitting: Genetic Counselor

## 2024-04-20 NOTE — Telephone Encounter (Signed)
 Patient provided consent for genetic counselor to access medical recording during appointment with patient's family member.

## 2024-08-27 ENCOUNTER — Other Ambulatory Visit: Payer: Self-pay | Admitting: Gynecologic Oncology

## 2024-08-27 DIAGNOSIS — D3912 Neoplasm of uncertain behavior of left ovary: Secondary | ICD-10-CM

## 2024-08-27 DIAGNOSIS — C541 Malignant neoplasm of endometrium: Secondary | ICD-10-CM

## 2024-09-03 ENCOUNTER — Inpatient Hospital Stay: Attending: Hematology & Oncology

## 2024-09-03 ENCOUNTER — Inpatient Hospital Stay

## 2024-09-03 ENCOUNTER — Ambulatory Visit (HOSPITAL_BASED_OUTPATIENT_CLINIC_OR_DEPARTMENT_OTHER)
Admission: RE | Admit: 2024-09-03 | Discharge: 2024-09-03 | Disposition: A | Discharge status: Home / Self Care | Source: Ambulatory Visit | Attending: Psychiatry | Admitting: Psychiatry

## 2024-09-03 ENCOUNTER — Encounter (HOSPITAL_BASED_OUTPATIENT_CLINIC_OR_DEPARTMENT_OTHER): Payer: Self-pay

## 2024-09-03 DIAGNOSIS — Z9079 Acquired absence of other genital organ(s): Secondary | ICD-10-CM | POA: Diagnosis not present

## 2024-09-03 DIAGNOSIS — C541 Malignant neoplasm of endometrium: Secondary | ICD-10-CM | POA: Insufficient documentation

## 2024-09-03 DIAGNOSIS — Z90710 Acquired absence of both cervix and uterus: Secondary | ICD-10-CM | POA: Diagnosis not present

## 2024-09-03 DIAGNOSIS — R971 Elevated cancer antigen 125 [CA 125]: Secondary | ICD-10-CM | POA: Insufficient documentation

## 2024-09-03 DIAGNOSIS — Z90722 Acquired absence of ovaries, bilateral: Secondary | ICD-10-CM | POA: Insufficient documentation

## 2024-09-03 DIAGNOSIS — D3912 Neoplasm of uncertain behavior of left ovary: Secondary | ICD-10-CM | POA: Insufficient documentation

## 2024-09-03 MED ORDER — IOHEXOL 300 MG/ML  SOLN
100.0000 mL | Freq: Once | INTRAMUSCULAR | Status: AC | PRN
  Administered 2024-09-03: 100 mL via INTRAVENOUS

## 2024-09-04 ENCOUNTER — Ambulatory Visit: Payer: Self-pay | Admitting: Gynecologic Oncology

## 2024-09-04 LAB — CA 125: Cancer Antigen (CA) 125: 17.8 U/mL (ref 0.0–38.1)

## 2024-09-10 ENCOUNTER — Encounter: Payer: Self-pay | Admitting: Psychiatry

## 2024-09-10 ENCOUNTER — Telehealth: Payer: Self-pay | Admitting: Oncology

## 2024-09-10 ENCOUNTER — Inpatient Hospital Stay: Admitting: Psychiatry

## 2024-09-10 VITALS — BP 142/82 | HR 84 | Temp 98.4°F | Resp 19 | Wt 201.6 lb

## 2024-09-10 DIAGNOSIS — C541 Malignant neoplasm of endometrium: Secondary | ICD-10-CM

## 2024-09-10 DIAGNOSIS — D3912 Neoplasm of uncertain behavior of left ovary: Secondary | ICD-10-CM

## 2024-09-10 NOTE — Telephone Encounter (Signed)
 [facility] and requested addendum on the CT report from 09/03/24 to clarify impression #1.

## 2024-09-10 NOTE — Progress Notes (Signed)
 Gynecologic Oncology Return Clinic Visit  Date of Service: 09/10/2024 Referring Provider: Katina Pfeiffer, PA-C [address] [place],  [place] [address]  Assessment & Plan: Haley Roach is a 53 y.o. 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  Endometrial cancer: - Incompletely staged due to incidental finding. Only grossly abnormal lymph node removed in the context of the adnexal mass and benign. No enlarged lymph nodes on preop imaging. - No high-intermediate risk factors. - Recommend observation. - Given no lymph node eval, CT a/p on 09/03/24 NED. Repeat in 64mo. If normal, may discontinue CT and repeat as indicated by symptoms/exam. - Signs/symptoms of recurrence reviewed. - Surveillance reviewed. Follow-up q6 months x2 years then yearly  Ovarian borderline tumor: - Reviewed no well established surveillance - Plan q24mo x2 years then yearly - CA125 17/8 on 09/03/24. - CA125 is a marker for her and will follow   RTC 64mo with CT and CA125 prior.  Hoy Masters, MD Gynecologic Oncology   Medical Decision Making I personally spent  TOTAL 20 minutes face-to-face and non-face-to-face in the care of this patient, which includes all pre, intra, and post visit time on the date of service.   ----------------------- Reason for Visit: Surveillance  Treatment History: Ovarian borderline tumor - CT A/P (01/06/24) IMPRESSION: 1. Large cystic and solid mass arising in the pelvis and extending to the supraumbilical abdomen, measuring roughly 27 x 14 x 20.8 cm and is suspicious for ovarian neoplasm. Small volume ascites in the pelvis. Mild haziness and stranding in the omentum raising concern for early omental disease. 2. Minimal  right hydronephrosis with otherwise normal caliber ureter and no obstructing stone. 3. Aortic atherosclerosis.  - Tumor markers (01/23/24): CA125 313; CEA 1.00;  CA19.9 - 15  - Surgery (02/21/24): Total abdominal hysterectomy, bilateral salpingo-oophorectomy, omental and peritoneal biopsies, left pelvic lymph node removal   OVARY or FALLOPIAN TUBE or PRIMARY PERITONEUM: Resection Procedure: Salpingo-oophorectomy Specimen Integrity: Intact Tumor Site: Left ovary Tumor Size: Borderline tumor 30 cm with multifocal intraepithelial carcinoma Histologic Type: Borderline tumor with intraepithelial carcinoma Histologic Grade: NA Ovarian Surface Involvement: Not identified Fallopian Tube Surface Involvement: Not identified Implants: Not applicable Lymphatic and/or Vascular Invasion:  Not identified Other Tissue/ Organ Involvement: Not applicable Largest Extrapelvic Peritoneal Focus: Not applicable Peritoneal/Ascitic Fluid Involvement: Not identified Chemotherapy Response Score (CRS): Not applicable, no known presurgical therapy Regional Lymph Nodes:      Number of Nodes with Metastasis Greater than 10 mm: 0      Number of Nodes with Metastasis 10 mm or Less (excludes isolated tumor cells): 0      Number of Nodes with Isolated Tumor Cells (0.2 mm or less): 0      Number of Lymph Nodes Examined: 1 Distant Metastasis:      Distant Site(s) Involved: Not applicable Pathologic Stage Classification (pTNM, AJCC 8th Edition): pT1a, pN0   Cytology: FINAL MICROSCOPIC DIAGNOSIS:  - No malignant cells identified   Oncology History  Endometrial cancer (HCC)  02/21/2024 Cancer Staging   Staging form: Corpus Uteri - Carcinoma and Carcinosarcoma, AJCC 8th Edition and FIGO 2023 - Pathologic stage from 02/21/2024: FIGO Stage IA2, calculated as Stage Unknown (pT1a, pNX, cM0, MMRd-, p53-) - Signed by Masters Hoy, MD on 03/12/2024 Histopathologic type: Endometrioid adenocarcinoma, NOS Stage prefix:  Initial  diagnosis Histologic grade (G): G1 Histologic grading system: 3 grade system   02/21/2024 Initial Diagnosis   Endometrial cancer (HCC) Incidental finding from surgery for ovarian mass   02/21/2024 Surgery   Total abdominal hysterectomy, bilateral salpingo-oophorectomy, omental and peritoneal biopsies, left pelvic lymph node removal  Findings: On entry to abdomen, small volume straw yellow ascites encountered.  Large approximately 30 by centimeter multilobulated cystic and solid mass filling the abdomen arising from the left ovary with evidence of torsion.  Few filmy adhesions from the omentum and the small bowel mesentery to the mass.  Otherwise normal upper abdominal survey with smooth diaphragm, liver, stomach.  Otherwise normal-appearing omentum, small bowel, and mesentery.  Normal appendix.  Normal-appearing uterus and right fallopian tube and ovary.  2 mm nodule on the left uterosacral ligament removed with uterine specimen.  Prominent left pelvic lymph node along the left external iliac vessels, removed. IOFS c/w mucinous borderline tumor.    02/21/2024 Pathology Results   A.   FALLOPIAN TUBE AND OVARY, LEFT, SALPINGO-OOPHARECTOMY: Mucinous borderline tumor with intraepithelial carcinoma. Tumor site: Left ovary. Tumor size: Borderline tumor: 30 cm.              Intraepithelial carcinoma: multifocal Benign fimbriated fallopian tube without diagnostic alteration. No evidence of invasion. See oncology table 1.  B.   OMENTUM: Benign adipose tissue, negative for malignancy.  C.   ADDITIONAL ADNEXA, LEFT, EXCISION: Benign vascular and adipose tissue. No ovarian parenchyma identified. Negative for neoplasm or malignancy.  D.   SMALL BOWEL MESENTERIC ADHESION, EXCISION: Benign fibrovascular tissue with focal chronic inflammation and reactive changes. Negative for malignancy.  E.   FALLOPAIN TUBE, OVARY, RIGHT, SALPINGO-OOPHORECTOMY: Benign ovarian parenchyma without diagnostic  alteration. Benign fimbriated fallopian tube with paratubal cyst. Negative for malignancy.  F.   UTERUS, CERVIX, HYSTERECTOMY: Endometrioid adenocarcinoma, FIGO grade 1. Carcinoma superficially invades 6% of myometrium (1 mm / 16 mm). No lymphovascular invasion identified. See oncology table 2.  Benign cervix without diagnostic alteration. Benign endocervix with focal microglandular hyperplasia. Myometrium with leiomyomata. Serosa unremarkable.  G.   LYMPH NODE, LEFT PELVIC, EXCISION: One lymph node, negative for metastatic carcinoma (0/1).  H.   PERITONEAL, LEFT PELVIC, BIOPSY: Fibroadipose tissue with acute inflammation and hemorrhage. Negative for malignancy.  I.   PERITONEAL, RIGHT PELVIC, BIOPSY: Fibroadipose tissue with acute inflammation and hemorrhage. Negative for malignancy.  J.   PERITONEAL, ANTERIOR CULDESAC, BIOPSY: Fibroadipose tissue with chronic inflammation and hemorrhage. Negative for malignancy.  K.   PERITONEAL, POSTERIOR CULDESAC, BIOPSY: Fibroadipose tissue with chronic inflammation and hemorrhage. Negative for malignancy.  L.   PERITONEAL, RIGHT PERICOLIC GUTTER, BIOPSY: Fibroadipose tissue with chronic inflammation and hemorrhage. Negative for malignancy.   UTERUS, CARCINOMA OR CARCINOSARCOMA: Resection  Procedure: Total hysterectomy Histologic Type: Endometrioid adenocarcinoma Histologic Grade: FIGO grade 1 Myometrial Invasion:      Depth of Myometrial Invasion (mm): 1 mm      Myometrial Thickness (mm): 16 mm      Percentage of Myometrial Invasion: 6% Uterine Serosa Involvement: Not identified Cervical stromal Involvement: Not identified Extent of involvement of other tissue/organs: Not identified Peritoneal/Ascitic Fluid: Not involved Lymphovascular Invasion: Not identified Regional Lymph Nodes:      Pelvic Lymph Nodes Examined:          [0] Sentinel          [1] Non-sentinel          [1] Total      Pelvic Lymph Nodes with Metastasis:  0  Macrometastasis: (>2.0 mm): 0          Micrometastasis: (>0.2 mm and < 2.0 mm): 0          Isolated Tumor Cells (<0.2 mm): 0          Laterality of Lymph Node with Tumor: 0          Extracapsular Extension: NA      Para-aortic Lymph Nodes Examined:          [0] Sentinel          [0] Non-sentinel          [0] Total      Para-aortic Lymph Nodes with Metastasis: NA          Macrometastasis: (>2.0 mm): NA          Micrometastasis:  (>0.2 mm and < 2.0 mm): NA          Isolated Tumor Cells (<0.2 mm): NA          Laterality of Lymph Node with Tumor: NA          Extracapsular Extension: NA Distant Metastasis:      Distant Site(s) Involved: Not applicable Pathologic Stage Classification (pTNM, AJCC 8th Edition): pT1a, pN0 Ancillary Studies: MMR / MSI testing will be ordered Representative Tumor Block: F5, F4 Comment(s): See comment ([id number])  COMMENT:  Selected slides of the ovarian mass and the endometrium were peer-reviewed by Dr. Reed who agrees with the diagnosis of mucinous borderline tumor with intraepithelial carcinoma and endometrioid carcinoma, FIGO grade 1.   IHC EXPRESSION RESULTS TEST           RESULT MLH1:          Preserved nuclear expression MSH2:          Preserved nuclear expression MSH6:          Preserved nuclear expression PMS2:          Preserved nuclear expression   Immunohistochemical stains show the tumor cells are focally and weakly  positive for ER. P53 show rare weak nuclear positivity / wild type  pattern of staining.      Interval History: Patient reports that she is overall doing well.  Presents with her husband today.  Improved bowel and bladder habits.  However experiencing weight gain and hot flashes.  But she feels the hot flashes are manageable.  Otherwise denies any new vaginal bleeding, abdominal/pelvic pain, unintentional weight loss, change in bowel or bladder habits, early satiety, bloating, nausea/vomiting.     Past  Medical/Surgical History: Past Medical History:  Diagnosis Date   Asthma    exercise induced asthma as a teenager   Headache    migraines   History of kidney stones    Hypertension    PONV (postoperative nausea and vomiting)    after tonsillectomy    Past Surgical History:  Procedure Laterality Date   EXCISION OF KELOID Right 03/16/2018   Procedure: EXCISION OF LOWER RIGHT BACK MASS;  Surgeon: Aron Shoulders, MD;  Location: [facility] OR;  Service: General;  Laterality: Right;   EXTRACORPOREAL SHOCK WAVE LITHOTRIPSY Right 01/31/2020   Procedure: EXTRACORPOREAL SHOCK WAVE LITHOTRIPSY (ESWL);  Surgeon: Gaston Hamilton, MD;  Location: [facility];  Service: Urology;  Laterality: Right;   ROBOTIC ASSISTED TOTAL HYSTERECTOMY N/A 02/21/2024   Procedure: TOTAL ABDOMINAL HYSTERECTOMY, BILATERAL SALPINGO-OOPHORECTOMY, OMENTAL AND PERITONEAL BIOPIES, PELVIC LYMPH NODE SAMPLING;  Surgeon: Eldonna Mays, MD;  Location: [facility];  Service: Gynecology;  Laterality: N/A;  TONSILLECTOMY     uterine ablation     WISDOM TOOTH EXTRACTION      Family History  Problem Relation Age of Onset   Hypertension Mother    Lung cancer Maternal Grandmother    Breast cancer Neg Hx    Ovarian cancer Neg Hx    Endometrial cancer Neg Hx    Prostate cancer Neg Hx    Colon cancer Neg Hx    Pancreatic cancer Neg Hx     Social History   Socioeconomic History   Marital status: Married    Spouse name: Not on file   Number of children: Not on file   Years of education: Not on file   Highest education level: Not on file  Occupational History   Not on file  Tobacco Use   Smoking status: Never    Passive exposure: Never   Smokeless tobacco: Never  Vaping Use   Vaping status: Never Used  Substance and Sexual Activity   Alcohol use: Yes    Comment: rarely   Drug use: Never   Sexual activity: Yes  Other Topics Concern   Not on file  Social History Narrative   Not on file   Social Drivers of  Health   Financial Resource Strain: Not on file  Food Insecurity: Patient Declined (02/21/2024)   Hunger Vital Sign    Worried About Running Out of Food in the Last Year: Patient declined    Ran Out of Food in the Last Year: Patient declined  Transportation Needs: Patient Declined (02/21/2024)   PRAPARE - Administrator, Civil Service (Medical): Patient declined    Lack of Transportation (Non-Medical): Patient declined  Physical Activity: Not on file  Stress: Not on file  Social Connections: Unknown (02/21/2024)   Social Connection and Isolation Panel    Frequency of Communication with Friends and Family: Patient declined    Frequency of Social Gatherings with Friends and Family: Patient declined    Attends Religious Services: Patient declined    Database administrator or Organizations: Patient declined    Attends Banker Meetings: Patient declined    Marital Status: Married    Current Medications:  Current Outpatient Medications:    amitriptyline  (ELAVIL ) 25 MG tablet, Take 25 mg by mouth at bedtime., Disp: , Rfl:    atorvastatin  (LIPITOR) 20 MG tablet, Take 20 mg by mouth at bedtime., Disp: , Rfl:    hydrOXYzine  (ATARAX ) 25 MG tablet, Take 25 mg by mouth daily as needed for anxiety (sleep)., Disp: , Rfl:    losartan (COZAAR) 50 MG tablet, Take 50 mg by mouth daily., Disp: , Rfl:    metoprolol  succinate (TOPROL -XL) 50 MG 24 hr tablet, Take 50 mg by mouth daily., Disp: , Rfl:    Multiple Vitamins-Minerals (MULTIVITAMIN WITH MINERALS) tablet, Take 1 tablet by mouth daily., Disp: , Rfl:    senna-docusate (SENOKOT-S) 8.6-50 MG tablet, Take 2 tablets by mouth at bedtime. For AFTER surgery, do not take if having diarrhea (Patient taking differently: Take 1 tablet by mouth at bedtime. For AFTER surgery, do not take if having diarrhea), Disp: 30 tablet, Rfl: 0  Review of Symptoms: Complete 10-system review is negative except as above in Interval History.  Physical  Exam: BP (!) 168/83 (BP Location: Right Arm, Patient Position: Sitting)   Pulse 84   Temp 98.4 F (36.9 C) (Oral)   Resp 19   Wt 201 lb 9.6 oz (91.4 kg)   SpO2 100%  BMI 34.60 kg/m  General: Alert, oriented, no acute distress. HEENT: Normocephalic, atraumatic. Neck symmetric without masses. Sclera anicteric.  Chest: Normal work of breathing. Clear to auscultation bilaterally.   Cardiovascular: Regular rate and rhythm, no murmurs. Abdomen: Soft, nontender.  Normoactive bowel sounds.  No masses or hepatosplenomegaly appreciated.  Well-healed scar. Extremities: Grossly normal range of motion.  Warm, well perfused.  No edema bilaterally. Skin: No rashes or lesions noted. Lymphatics: No cervical, supraclavicular, or inguinal adenopathy. GU: Normal appearing external genitalia without erythema, excoriation, or lesions.  Speculum exam reveals normal vaginal cuff and mucosa.  Bimanual exam reveals smooth cuff, no pelvic mass or nodularity.  Exam chaperoned by Kimberly [place], CMA    Laboratory & Radiologic Studies: Lab Results  Component Value Date   CAN125 17.8 09/03/2024   CAN125 313.0 (H) 01/23/2024   CT ABDOMEN PELVIS W CONTRAST 09/03/2024  Narrative CLINICAL DATA:  Genital cancer, monitor.  * Tracking Code: BO *  EXAM: CT ABDOMEN AND PELVIS WITH CONTRAST  TECHNIQUE: Multidetector CT imaging of the abdomen and pelvis was performed using the standard protocol following bolus administration of intravenous contrast.  RADIATION DOSE REDUCTION: This exam was performed according to the departmental dose-optimization program which includes automated exposure control, adjustment of the mA and/or kV according to patient size and/or use of iterative reconstruction technique.  CONTRAST:  100mL OMNIPAQUE IOHEXOL 300 MG/ML  SOLN  COMPARISON:  CT January 06, 2024  FINDINGS: Lower chest: No acute abnormality.  Hepatobiliary: No suspicious hepatic lesion. Gallbladder  is unremarkable. No biliary ductal dilation.  Pancreas: No pancreatic ductal dilation or evidence of acute inflammation.  Spleen: No splenomegaly.  Adrenals/Urinary Tract: Bilateral adrenal glands appear normal. No hydronephrosis. Kidneys demonstrate symmetric enhancement. Urinary bladder is unremarkable for degree of distension.  Stomach/Bowel: Radiopaque enteric contrast material traverses the sigmoid colon. Stomach is unremarkable for minimal distension. No pathologic dilation of small or large bowel. Noninflamed appendix.  Vascular/Lymphatic: Aortic atherosclerosis. Smooth IVC contours. The portal, splenic and superior mesenteric veins are patent. No pathologically enlarged abdominal or pelvic lymph nodes.  Reproductive: Uterus is surgically absent. No suspicious adnexal mass.  Other: No significant abdominopelvic free fluid. No discrete peritoneal or omental nodularity. Postsurgical change in the abdominal wall.  Musculoskeletal: No aggressive lytic or blastic lesion of bone.  IMPRESSION: 1. Status evidence of local recurrence or metastatic disease in the abdomen or pelvis. 2. Aortic atherosclerosis.  Aortic Atherosclerosis (ICD10-I70.0).   Electronically Signed By: Reyes Holder M.D. On: 09/05/2024 15:17

## 2024-09-10 NOTE — Patient Instructions (Signed)
 It was a pleasure to see you in clinic today. - normal exam today. Ca125 normal. Ct scan normal. - Return visit planned for 60mo with ct and ca125 prior.  Thank you very much for allowing me to provide care for you today.  I appreciate your confidence in choosing our Gynecologic Oncology team at [facility].  If you have any questions about your visit today please call our office or send us  a MyChart message and we will get back to you as soon as possible.

## 2025-03-04 ENCOUNTER — Inpatient Hospital Stay: Attending: Hematology & Oncology

## 2025-03-04 ENCOUNTER — Other Ambulatory Visit (HOSPITAL_COMMUNITY)

## 2025-03-11 ENCOUNTER — Inpatient Hospital Stay: Admitting: Psychiatry

## 2025-03-18 ENCOUNTER — Inpatient Hospital Stay: Admitting: Psychiatry
